# Patient Record
Sex: Female | Born: 1980 | Race: White | Hispanic: No | Marital: Married | Smoking: Former smoker
Health system: Southern US, Community
[De-identification: ages and names within clinical notes are randomized; demographics above are authoritative.]

## PROBLEM LIST (undated history)

## (undated) DIAGNOSIS — L700 Acne vulgaris: Secondary | ICD-10-CM

## (undated) DIAGNOSIS — E282 Polycystic ovarian syndrome: Secondary | ICD-10-CM

## (undated) DIAGNOSIS — D509 Iron deficiency anemia, unspecified: Secondary | ICD-10-CM

## (undated) HISTORY — DX: Acne vulgaris: L70.0

## (undated) HISTORY — DX: Polycystic ovarian syndrome: E28.2

## (undated) HISTORY — DX: Iron deficiency anemia, unspecified: D50.9

---

## 2004-11-17 HISTORY — PX: LAPAROSCOPIC CHOLECYSTECTOMY: SUR755

## 2004-12-14 ENCOUNTER — Emergency Department: Payer: Self-pay | Admitting: Emergency Medicine

## 2004-12-18 ENCOUNTER — Ambulatory Visit: Payer: Self-pay | Admitting: Emergency Medicine

## 2005-01-03 ENCOUNTER — Ambulatory Visit: Payer: Self-pay | Admitting: General Surgery

## 2007-11-18 HISTORY — PX: TUBAL LIGATION: SHX77

## 2010-03-18 ENCOUNTER — Encounter: Payer: Self-pay | Admitting: Family Medicine

## 2010-06-18 ENCOUNTER — Encounter: Payer: Self-pay | Admitting: Family Medicine

## 2010-08-29 ENCOUNTER — Encounter: Payer: Self-pay | Admitting: Family Medicine

## 2010-10-25 ENCOUNTER — Ambulatory Visit: Payer: Self-pay | Admitting: Family Medicine

## 2010-12-06 ENCOUNTER — Other Ambulatory Visit
Admission: RE | Admit: 2010-12-06 | Discharge: 2010-12-06 | Payer: Self-pay | Source: Home / Self Care | Admitting: Family Medicine

## 2010-12-06 ENCOUNTER — Ambulatory Visit
Admission: RE | Admit: 2010-12-06 | Discharge: 2010-12-06 | Payer: Self-pay | Source: Home / Self Care | Attending: Family Medicine | Admitting: Family Medicine

## 2010-12-06 ENCOUNTER — Other Ambulatory Visit: Payer: Self-pay | Admitting: Family Medicine

## 2010-12-06 DIAGNOSIS — B079 Viral wart, unspecified: Secondary | ICD-10-CM | POA: Insufficient documentation

## 2010-12-12 ENCOUNTER — Encounter: Payer: Self-pay | Admitting: Family Medicine

## 2010-12-19 NOTE — Assessment & Plan Note (Signed)
Summary: NEW PT TO BE ESTABLISHED/JRR   Vital Signs:  Patient profile:   30 year old female Height:      64.50 inches Weight:      162 pounds BMI:     27.48 Temp:     98.0 degrees F oral Pulse rate:   72 / minute Pulse rhythm:   regular BP sitting:   120 / 86  (right arm) Cuff size:   regular  Vitals Entered By: Linde Gillis CMA Duncan Dull) (2010/12/12 10:53 AM) CC: new patient, establish care   History of Present Illness: 30 yo here to establish care.  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  Well woman- G3P3, last pap in 2008.  No h/o abnormal pap smears.  Sexuallya active with husband only.  Denies any vaginal discharge, lesions or dysuria. s/p BTL.  Lesions on her hands- noticed it a few months ago.  Started a raised lesion next to one finger nail on left hand and now has several on different fingers.  Non painful.  She is embarrassed of how they look.  Preventive Screening-Counseling & Management  Alcohol-Tobacco     Smoking Status: never      Drug Use:  no.    Current Medications (verified): 1)  Salicylic Acid 17 % Soln (Salicylic Acid) .... Apply To Each Wart and Allow To Dry. May Repeat Once or Twice Daily, Up To 12 Weeks. 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 Eyes:  Denies blurring. ENT:  Denies difficulty swallowing. CV:  Denies  chest pain or discomfort. Resp:  Denies shortness of breath. GI:  Denies abdominal pain. GU:  Denies abnormal vaginal bleeding, discharge, and dysuria. MS:  Denies joint pain, joint redness, and joint swelling. Derm:  Denies rash. Neuro:  Denies headaches. Psych:  Denies anxiety and depression. Endo:  Denies cold intolerance and heat intolerance. Heme:  Denies abnormal bruising and bleeding.  Physical Exam  General:  Well-developed,well-nourished,in no acute distress; alert,appropriate and cooperative throughout examination Head:  normocephalic, atraumatic, no abnormalities observed, and no abnormalities palpated.   Eyes:  vision grossly intact and pupils equal.   Ears:  R ear normal and L ear normal.   Nose:  no external deformity.   Mouth:  good dentition.   Neck:  No deformities, masses, or tenderness noted. Breasts:  No mass, nodules, thickening, tenderness, bulging, retraction, inflamation, nipple discharge or skin changes noted.   Lungs:  Normal respiratory effort, chest expands symmetrically. Lungs are clear to auscultation, no crackles or wheezes. Heart:  Normal rate and regular rhythm. S1 and S2 normal without gallop, murmur, click, rub or other extra sounds. Abdomen:  Bowel sounds positive,abdomen soft and non-tender without masses, organomegaly or hernias noted. Rectal:  no external abnormalities.   Genitalia:  Pelvic Exam:        External: normal female genitalia without lesions or masses  Vagina: normal without lesions or masses        Cervix: normal without lesions or masses        Adnexa: normal bimanual exam without masses or fullness        Uterus: normal by palpation        Pap smear: performed Msk:  No deformity or scoliosis noted of thoracic or lumbar spine.   Extremities:  no edema Neurologic:  alert & oriented X3 and gait normal.   Skin:  multiple periungual warts on left hand- 1st, 2nd, 3rd digit Axillary Nodes:  No palpable lymphadenopathy Psych:   Cognition and judgment appear intact. Alert and cooperative with normal attention span and concentration. No apparent delusions, illusions, hallucinations   Impression & Recommendations:  Problem # 1:  Preventive Health Care (ICD-V70.0) Reviewed preventive care protocols, scheduled due services, and updated immunizations Discussed nutrition, exercise, diet, and healthy lifestyle.  Brings in labs from her health assessment at work.  All within normal limits, discussed with pt and entered into emr.  Problem # 2:  WARTS, MULTIPLE (ICD-078.10) Assessment: New periungual.  discussed treatment options.  Cryotherapy not ideal given location in nail bed.  Will try topical salicylic acid first.  Follow up in 3 months, sooner if no improvement or they worsen.  Complete Medication List: 1)  Salicylic Acid 17 % Soln (Salicylic acid) .... Apply to each wart and allow to dry. may repeat once or twice daily, up to 12 weeks. apply to clean dry area Prescriptions: SALICYLIC ACID 17 % SOLN (SALICYLIC ACID) Apply to each wart and allow to dry. May repeat once or twice daily, up to 12 weeks. Apply to clean dry area  #1 x 0   Entered and Authorized by:   Ruthe Mannan MD   Signed by:   Ruthe Mannan MD on 12/06/2010   Method used:   Print then Give to Patient   RxID:   [id number]    Orders Added: 1)  New Patient 18-39 years [99385] 2)  New Patient Level II [99202]    Prior Medications (reviewed today): None Current Allergies (reviewed today): No known allergies   Flu Vaccine Result Date:  08/29/2010 Flu Vaccine Result:  historical TD Result Date:  01/19/2008 TD Result:  historical TD Next Due:  10 yr HDL Result Date:  08/29/2010 HDL Result:  41 LDL Result Date:  08/29/2010 LDL Result:  69

## 2010-12-19 NOTE — Letter (Signed)
Summary: Results Follow up Letter  [facility]  [address] [place], [place] [address]   Phone: [phone]  Fax: [phone]    12/12/2010 MRN: [id number]  Leslie Jordan [address] [place], [place]  [address]  Dear Ms. Laszlo,  The following are the results of your recent test(s):  Test         Result    Pap Smear:        Normal __X___  Not Normal _____ Comments: GC/Chlamydia neg ______________________________________________________ Cholesterol: LDL(Bad cholesterol):         Your goal is less than:         HDL (Good cholesterol):       Your goal is more than: Comments:  ______________________________________________________ Mammogram:        Normal _____  Not Normal _____ Comments:  ___________________________________________________________________ Hemoccult:        Normal _____  Not normal _______ Comments:    _____________________________________________________________________ Other Tests:    We routinely do not discuss normal results over the telephone.  If you desire a copy of the results, or you have any questions about this information we can discuss them at your next office visit.   Sincerely,      Dr. Ruthe Mannan

## 2012-12-31 ENCOUNTER — Ambulatory Visit: Payer: Self-pay | Admitting: Family Medicine

## 2013-01-05 ENCOUNTER — Ambulatory Visit: Payer: Self-pay | Admitting: Internal Medicine

## 2013-01-07 ENCOUNTER — Encounter: Payer: Self-pay | Admitting: Family Medicine

## 2013-01-07 ENCOUNTER — Ambulatory Visit (INDEPENDENT_AMBULATORY_CARE_PROVIDER_SITE_OTHER): Payer: 59 | Admitting: Family Medicine

## 2013-01-07 VITALS — BP 118/78 | HR 80 | Temp 99.4°F | Wt 191.0 lb

## 2013-01-07 DIAGNOSIS — R5381 Other malaise: Secondary | ICD-10-CM | POA: Insufficient documentation

## 2013-01-07 DIAGNOSIS — R5383 Other fatigue: Secondary | ICD-10-CM

## 2013-01-07 DIAGNOSIS — R635 Abnormal weight gain: Secondary | ICD-10-CM | POA: Insufficient documentation

## 2013-01-07 LAB — CBC WITH DIFFERENTIAL/PLATELET
Basophils Relative: 1 % (ref 0–1)
Hemoglobin: 12.2 g/dL (ref 12.0–15.0)
Lymphocytes Relative: 30 % (ref 12–46)
Lymphs Abs: 2.5 10*3/uL (ref 0.7–4.0)
Monocytes Relative: 6 % (ref 3–12)
Neutro Abs: 5.3 10*3/uL (ref 1.7–7.7)
Neutrophils Relative %: 62 % (ref 43–77)
RBC: 4.51 MIL/uL (ref 3.87–5.11)
WBC: 8.5 10*3/uL (ref 4.0–10.5)

## 2013-01-07 NOTE — Patient Instructions (Addendum)
Good to see you. We will call you with your lab results. If your labs are normal, we can talk about phentermine.

## 2013-01-07 NOTE — Progress Notes (Signed)
  Subjective:    Patient ID: Leslie Jordan, female    DOB: Apr 05, 1981, 32 y.o.   MRN: [id number]  HPI  Very pleasant 32 yo here to discuss weight gain and fatigue x 1 year.  Last saw her in 11/2010 and she weighed 162 pounds.  She states that she is very active and exercises regularly so not sure why she has gained 30 pounds in the past year. She does feel very fatigued and does endorse some constipation.  No hot or cold intolerance.  No palpitations or depression. She does have a family h/o both over and under active thyroid.  Brings in lab work from work- a1c, lipid panel and renal function normal.  Patient Active Problem List  Diagnosis  . WARTS, MULTIPLE  . Weight gain  . Other malaise and fatigue   No past medical history on file. No past surgical history on file. History  Substance Use Topics  . Smoking status: Former Games developer  . Smokeless tobacco: Not on file  . Alcohol Use: Not on file   No family history on file. No Known Allergies No current outpatient prescriptions on file prior to visit.   No current facility-administered medications on file prior to visit.   The PMH, PSH, Social History, Family History, Medications, and allergies have been reviewed in [facility], and have been updated if relevant.   Review of Systems See HPI Sleeping ok    Objective:   Physical Exam BP 118/78  Pulse 80  Temp(Src) 99.4 F (37.4 C)  Wt 191 lb (86.637 kg)  BMI 32.29 kg/m2 Wt Readings from Last 3 Encounters:  01/07/13 191 lb (86.637 kg)   General:  Well-developed,well-nourished,in no acute distress; alert,appropriate and cooperative throughout examination Head:  normocephalic and atraumatic.   Eyes:  vision grossly intact, pupils equal, pupils round, and pupils reactive to light.   Ears:  R ear normal and L ear normal.   Nose:  no external deformity.   Mouth:  good dentition Neck:  No masses or thyromegaly.   Lungs:  Normal respiratory effort, chest expands symmetrically. Lungs  are clear to auscultation, no crackles or wheezes. Heart:  Normal rate and regular rhythm. S1 and S2 normal without gallop, murmur, click, rub or other extra sounds. Abdomen:  Bowel sounds positive,abdomen soft and non-tender without masses, organomegaly or hernias noted. Msk:  No deformity or scoliosis noted of thoracic or lumbar spine.   Extremities:  No clubbing, cyanosis, edema, or deformity noted with normal full range of motion of all joints.   Neurologic:  alert & oriented X3 and gait normal.   Skin:  Intact without suspicious lesions or rashes Cervical Nodes:  No lymphadenopathy noted Axillary Nodes:  No palpable lymphadenopathy Psych:  Cognition and judgment appear intact. Alert and cooperative with normal attention span and concentration. No apparent delusions, illusions, hallucinations    Assessment & Plan:  1. Weight gain Will check thyroid function.  If normal, we did discuss proceeding with nutrition referral and possible short course of an appetite suppressant.  The patient indicates understanding of these issues and agrees with the plan.  *- TSH - T4, Free  2. Other malaise and fatigue See above. - TSH - T4, Free - CBC with Differential

## 2013-01-08 LAB — TSH: TSH: 1.432 u[IU]/mL (ref 0.350–4.500)

## 2013-01-08 LAB — T4, FREE: Free T4: 0.93 ng/dL (ref 0.80–1.80)

## 2013-01-11 ENCOUNTER — Other Ambulatory Visit: Payer: Self-pay | Admitting: Family Medicine

## 2013-01-11 MED ORDER — PHENTERMINE HCL 15 MG PO CAPS: 15.0000 mg | ORAL_CAPSULE | ORAL | Status: DC

## 2013-01-20 ENCOUNTER — Encounter: Payer: Self-pay | Admitting: Family Medicine

## 2013-02-04 ENCOUNTER — Encounter: Payer: Self-pay | Admitting: Family Medicine

## 2013-02-04 ENCOUNTER — Ambulatory Visit (INDEPENDENT_AMBULATORY_CARE_PROVIDER_SITE_OTHER): Payer: 59 | Admitting: Family Medicine

## 2013-02-04 VITALS — BP 92/70 | HR 84 | Temp 98.1°F | Wt 187.0 lb

## 2013-02-04 DIAGNOSIS — R635 Abnormal weight gain: Secondary | ICD-10-CM

## 2013-02-04 MED ORDER — PHENTERMINE HCL 30 MG PO CAPS: 30.0000 mg | ORAL_CAPSULE | ORAL | Status: DC

## 2013-02-04 NOTE — Patient Instructions (Addendum)
Good to see you. You look great! Please come see me in one month.   

## 2013-02-04 NOTE — Progress Notes (Signed)
  Subjective:    Patient ID: Leslie Jordan, female    DOB: [birth year], 32 y.o.   MRN: [id number]  HPI  Very pleasant 32 yo here for one month follow up weight gain.    Last saw her in 11/2010 and she weighed 162 pounds.  She states that she is very active and exercises regularly so not sure why she has gained 30 pounds in the past year.  Last month, started phentermine 15 mg daily.  It has decreased appetite.    Denies any CP, SOB or palpitations.    Patient Active Problem List  Diagnosis  . WARTS, MULTIPLE  . Weight gain  . Other malaise and fatigue   No past medical history on file. No past surgical history on file. History  Substance Use Topics  . Smoking status: Former Games developer  . Smokeless tobacco: Not on file  . Alcohol Use: Not on file   No family history on file. No Known Allergies Current Outpatient Prescriptions on File Prior to Visit  Medication Sig Dispense Refill  . phentermine 15 MG capsule Take 1 capsule (15 mg total) by mouth every morning.  30 capsule  0   No current facility-administered medications on file prior to visit.   The PMH, PSH, Social History, Family History, Medications, and allergies have been reviewed in [facility], and have been updated if relevant.   Review of Systems See HPI Sleeping ok    Objective:   Physical Exam BP 92/70  Pulse 84  Temp(Src) 98.1 F (36.7 C)  Wt 187 lb (84.823 kg)  BMI 31.61 kg/m2 Wt Readings from Last 3 Encounters:  02/04/13 187 lb (84.823 kg)  01/07/13 191 lb (86.637 kg)    General:  Well-developed,well-nourished,in no acute distress; alert,appropriate and cooperative throughout examination Head:  normocephalic and atraumatic.   Eyes:  vision grossly intact, pupils equal, pupils round, and pupils reactive to light.   Ears:  R ear normal and L ear normal.   Nose:  no external deformity.   Mouth:  good dentition Neck:  No masses or thyromegaly.   Lungs:  Normal respiratory effort, chest expands symmetrically.  Lungs are clear to auscultation, no crackles or wheezes. Heart:  Normal rate and regular rhythm. S1 and S2 normal without gallop, murmur, click, rub or other extra sounds. Abdomen:  Bowel sounds positive,abdomen soft and non-tender without masses, organomegaly or hernias noted. Msk:  No deformity or scoliosis noted of thoracic or lumbar spine.   Extremities:  No clubbing, cyanosis, edema, or deformity noted with normal full range of motion of all joints.   Neurologic:  alert & oriented X3 and gait normal.   Skin:  Intact without suspicious lesions or rashes Cervical Nodes:  No lymphadenopathy noted Axillary Nodes:  No palpable lymphadenopathy Psych:  Cognition and judgment appear intact. Alert and cooperative with normal attention span and concentration. No apparent delusions, illusions, hallucinations    Assessment & Plan:  1. Weight gain Improved.  Congratulated her on her success. Increase dose of phentermine to 30 mg daily.  Follow up in one month. The patient indicates understanding of these issues and agrees with the plan.

## 2013-02-28 ENCOUNTER — Ambulatory Visit (INDEPENDENT_AMBULATORY_CARE_PROVIDER_SITE_OTHER): Payer: 59 | Admitting: Family Medicine

## 2013-02-28 ENCOUNTER — Encounter: Payer: Self-pay | Admitting: Family Medicine

## 2013-02-28 VITALS — BP 110/80 | HR 92 | Temp 98.2°F | Wt 186.0 lb

## 2013-02-28 DIAGNOSIS — R635 Abnormal weight gain: Secondary | ICD-10-CM

## 2013-02-28 MED ORDER — PHENTERMINE HCL 37.5 MG PO CAPS: 37.5000 mg | ORAL_CAPSULE | ORAL | Status: DC

## 2013-02-28 NOTE — Patient Instructions (Addendum)
Please call me in one month with your blood pressure readings.

## 2013-02-28 NOTE — Progress Notes (Signed)
  Subjective:    Patient ID: Leslie Jordan, female    DOB: 02/14/1981, 32 y.o.   MRN: [id number]  HPI  Very pleasant 32 yo here for one month follow up weight gain.    Two months ago, started phentermine.  It has decreased appetite.    Denies any CP, SOB or palpitations.    Has not been exercising as much this past couple of weeks.  Patient Active Problem List  Diagnosis  . WARTS, MULTIPLE  . Weight gain  . Other malaise and fatigue   No past medical history on file. No past surgical history on file. History  Substance Use Topics  . Smoking status: Former Games developer  . Smokeless tobacco: Not on file  . Alcohol Use: Not on file   No family history on file. No Known Allergies Current Outpatient Prescriptions on File Prior to Visit  Medication Sig Dispense Refill  . phentermine 30 MG capsule Take 1 capsule (30 mg total) by mouth every morning.  30 capsule  0   No current facility-administered medications on file prior to visit.   The PMH, PSH, Social History, Family History, Medications, and allergies have been reviewed in [facility], and have been updated if relevant.   Review of Systems See HPI Sleeping ok    Objective:   Physical Exam BP 110/80  Pulse 92  Temp(Src) 98.2 F (36.8 C)  Wt 186 lb (84.369 kg)  BMI 31.45 kg/m2 Wt Readings from Last 3 Encounters:  02/28/13 186 lb (84.369 kg)  02/04/13 187 lb (84.823 kg)  01/07/13 191 lb (86.637 kg)    General:  Well-developed,well-nourished,in no acute distress; alert,appropriate and cooperative throughout examination Head:  normocephalic and atraumatic.   Eyes:  vision grossly intact, pupils equal, pupils round, and pupils reactive to light.   Ears:  R ear normal and L ear normal.   Nose:  no external deformity.   Mouth:  good dentition Neck:  No masses or thyromegaly.   Lungs:  Normal respiratory effort, chest expands symmetrically. Lungs are clear to auscultation, no crackles or wheezes. Heart:  Normal rate and regular  rhythm. S1 and S2 normal without gallop, murmur, click, rub or other extra sounds. Abdomen:  Bowel sounds positive,abdomen soft and non-tender without masses, organomegaly or hernias noted. Msk:  No deformity or scoliosis noted of thoracic or lumbar spine.   Extremities:  No clubbing, cyanosis, edema, or deformity noted with normal full range of motion of all joints.   Neurologic:  alert & oriented X3 and gait normal.   Skin:  Intact without suspicious lesions or rashes Cervical Nodes:  No lymphadenopathy noted Axillary Nodes:  No palpable lymphadenopathy Psych:  Cognition and judgment appear intact. Alert and cooperative with normal attention span and concentration. No apparent delusions, illusions, hallucinations    Assessment & Plan:  1. Weight gain Improved.  Congratulated her on her success. Increase dose of phentermine to 37.5 mg daily.  Follow up in one month. The patient indicates understanding of these issues and agrees with the plan.

## 2013-03-11 ENCOUNTER — Ambulatory Visit: Payer: 59 | Admitting: Family Medicine

## 2013-04-13 ENCOUNTER — Other Ambulatory Visit: Payer: Self-pay

## 2013-04-13 MED ORDER — PHENTERMINE HCL 37.5 MG PO CAPS: 37.5000 mg | ORAL_CAPSULE | ORAL | Status: DC

## 2013-04-13 NOTE — Telephone Encounter (Signed)
Ok to print and put in my box for signature. 

## 2013-04-13 NOTE — Telephone Encounter (Signed)
Ok to call in

## 2013-04-13 NOTE — Telephone Encounter (Signed)
Pt request rx phentermine and BP readings 03/08/13 BP 117/78;  04/09/13 BP 124/76 and 04/13/13 BP 117/80. No H/A, dizziness or CP. Call when rx ready for pick up.

## 2013-04-13 NOTE — Telephone Encounter (Signed)
This can actually be called to her pharmacy.

## 2013-04-13 NOTE — Telephone Encounter (Signed)
Medicine called to pharmacy, advised patient. 

## 2013-08-12 ENCOUNTER — Encounter: Payer: Self-pay | Admitting: Family Medicine

## 2013-08-12 ENCOUNTER — Ambulatory Visit (INDEPENDENT_AMBULATORY_CARE_PROVIDER_SITE_OTHER): Payer: 59 | Admitting: Family Medicine

## 2013-08-12 VITALS — BP 118/74 | HR 76 | Temp 98.2°F | Wt 176.5 lb

## 2013-08-12 DIAGNOSIS — R194 Change in bowel habit: Secondary | ICD-10-CM | POA: Insufficient documentation

## 2013-08-12 DIAGNOSIS — R198 Other specified symptoms and signs involving the digestive system and abdomen: Secondary | ICD-10-CM

## 2013-08-12 NOTE — Progress Notes (Signed)
  Subjective:    Patient ID: Leslie Jordan, female    DOB: 1981-02-11, 32 y.o.   MRN: [id number]  HPI  Very pleasant 32 yo female here for 2 months of changes in bowel habits.  Alternating between weeks of diarrhea and weeks of constipation and bloating for past 2 months.  No blood or mucous in her stools.  Has not noticed if certain foods make it worse.  No known FH of IBD.  Patient Active Problem List   Diagnosis Date Noted  . Weight gain 01/07/2013  . Other malaise and fatigue 01/07/2013  . WARTS, MULTIPLE 12/06/2010   No past medical history on file. No past surgical history on file. History  Substance Use Topics  . Smoking status: Former Games developer  . Smokeless tobacco: Not on file  . Alcohol Use: No   No family history on file. No Known Allergies No current outpatient prescriptions on file prior to visit.   No current facility-administered medications on file prior to visit.   The PMH, PSH, Social History, Family History, Medications, and allergies have been reviewed in [facility], and have been updated if relevant.   Review of Systems See HPI No fevers No decreased appetite. Wt Readings from Last 3 Encounters:  08/12/13 176 lb 8 oz (80.06 kg)  02/28/13 186 lb (84.369 kg)  02/04/13 187 lb (84.823 kg)   No abdominal pain    Objective:   Physical Exam BP 118/74  Pulse 76  Temp(Src) 98.2 F (36.8 C) (Oral)  Wt 176 lb 8 oz (80.06 kg)  BMI 29.84 kg/m2  LMP 08/02/2013  General:  Well-developed,well-nourished,in no acute distress; alert,appropriate and cooperative throughout examination Head:  normocephalic and atraumatic.   Abdomen:  Bowel sounds positive,abdomen soft and non-tender without masses, organomegaly or hernias noted. Msk:  No deformity or scoliosis noted of thoracic or lumbar spine.   Extremities:  No clubbing, cyanosis, edema, or deformity noted with normal full range of motion of all joints.   Neurologic:  alert & oriented X3 and gait normal.    Skin:  Intact without suspicious lesions or rashes Psych:  Cognition and judgment appear intact. Alert and cooperative with normal attention span and concentration. No apparent delusions, illusions, hallucinations     Assessment & Plan:  1. Bowel habit changes New. No red flag symptoms. 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 See AVS for supportive care- start align, keep food journal and eliminate gasy foods. She will call me in a few weeks with an update. The patient indicates understanding of these issues and agrees with the plan.

## 2013-08-12 NOTE — Patient Instructions (Addendum)
Increase fiber and water, and try over the counter gas-x (four times a day when necessary) or beano for bloating.   Trial of align for bloating/IBS symptoms (OTC).  After two weeks, if no improvement, try pepcid 20 mg daily for the next 2 weeks.  Exclude gas producing foods (beans, onions, celery, carrots, raisins, bananas, apricots, prunes, brussel sprouts, wheat germ, pretzels)   Consider trail of lactose free diet (back off milk)   Call me in a few weeks with an update.

## 2013-09-22 ENCOUNTER — Other Ambulatory Visit: Payer: Self-pay

## 2013-11-25 ENCOUNTER — Other Ambulatory Visit (INDEPENDENT_AMBULATORY_CARE_PROVIDER_SITE_OTHER): Payer: 59

## 2013-11-25 ENCOUNTER — Other Ambulatory Visit: Payer: Self-pay | Admitting: Family Medicine

## 2013-11-25 DIAGNOSIS — Z0000 Encounter for general adult medical examination without abnormal findings: Secondary | ICD-10-CM

## 2013-11-25 DIAGNOSIS — Z136 Encounter for screening for cardiovascular disorders: Secondary | ICD-10-CM

## 2013-11-25 LAB — COMPREHENSIVE METABOLIC PANEL
ALBUMIN: 4 g/dL (ref 3.5–5.2)
ALK PHOS: 80 U/L (ref 39–117)
ALT: 17 U/L (ref 0–35)
AST: 21 U/L (ref 0–37)
BUN: 10 mg/dL (ref 6–23)
CALCIUM: 9.1 mg/dL (ref 8.4–10.5)
CHLORIDE: 103 meq/L (ref 96–112)
CO2: 29 meq/L (ref 19–32)
Creatinine, Ser: 0.7 mg/dL (ref 0.4–1.2)
GFR: 96.76 mL/min (ref >60.00)
GLUCOSE: 96 mg/dL (ref 70–99)
POTASSIUM: 4.4 meq/L (ref 3.5–5.1)
SODIUM: 137 meq/L (ref 135–145)
TOTAL PROTEIN: 7.1 g/dL (ref 6.0–8.3)
Total Bilirubin: 0.7 mg/dL (ref 0.3–1.2)

## 2013-11-25 LAB — CBC WITH DIFFERENTIAL/PLATELET
BASOS ABS: 0 10*3/uL (ref 0.0–0.1)
Basophils Relative: 0.7 % (ref 0.0–3.0)
EOS PCT: 1.2 % (ref 0.0–5.0)
Eosinophils Absolute: 0.1 10*3/uL (ref 0.0–0.7)
HCT: 39.2 % (ref 36.0–46.0)
Hemoglobin: 13.3 g/dL (ref 12.0–15.0)
Lymphocytes Relative: 28.6 % (ref 12.0–46.0)
Lymphs Abs: 1.9 10*3/uL (ref 0.7–4.0)
MCHC: 33.8 g/dL (ref 30.0–36.0)
MCV: 83.1 fl (ref 78.0–100.0)
MONO ABS: 0.5 10*3/uL (ref 0.1–1.0)
MONOS PCT: 7.3 % (ref 3.0–12.0)
NEUTROS PCT: 62.2 % (ref 43.0–77.0)
Neutro Abs: 4.1 10*3/uL (ref 1.4–7.7)
Platelets: 333 10*3/uL (ref 150.0–400.0)
RBC: 4.72 Mil/uL (ref 3.87–5.11)
RDW: 15.1 % — AB (ref 11.5–14.6)
WBC: 6.7 10*3/uL (ref 4.5–10.5)

## 2013-11-25 LAB — LIPID PANEL
CHOLESTEROL: 168 mg/dL (ref 0–200)
HDL: 45.6 mg/dL (ref >39.00)
LDL CALC: 107 mg/dL — AB (ref 0–99)
TRIGLYCERIDES: 78 mg/dL (ref 0.0–149.0)
Total CHOL/HDL Ratio: 4
VLDL: 15.6 mg/dL (ref 0.0–40.0)

## 2013-11-25 LAB — TSH: TSH: 0.66 u[IU]/mL (ref 0.35–5.50)

## 2013-12-02 ENCOUNTER — Other Ambulatory Visit (HOSPITAL_COMMUNITY)
Admission: RE | Admit: 2013-12-02 | Discharge: 2013-12-02 | Disposition: A | Discharge status: Home / Self Care | Payer: 59 | Source: Ambulatory Visit | Attending: Family Medicine | Admitting: Family Medicine

## 2013-12-02 ENCOUNTER — Ambulatory Visit (INDEPENDENT_AMBULATORY_CARE_PROVIDER_SITE_OTHER): Payer: 59 | Admitting: Family Medicine

## 2013-12-02 ENCOUNTER — Encounter: Payer: Self-pay | Admitting: Family Medicine

## 2013-12-02 VITALS — BP 122/76 | HR 112 | Temp 97.8°F | Ht 64.0 in | Wt 177.5 lb

## 2013-12-02 DIAGNOSIS — R635 Abnormal weight gain: Secondary | ICD-10-CM

## 2013-12-02 DIAGNOSIS — Z124 Encounter for screening for malignant neoplasm of cervix: Secondary | ICD-10-CM | POA: Insufficient documentation

## 2013-12-02 DIAGNOSIS — Z1151 Encounter for screening for human papillomavirus (HPV): Secondary | ICD-10-CM | POA: Insufficient documentation

## 2013-12-02 DIAGNOSIS — Z0000 Encounter for general adult medical examination without abnormal findings: Secondary | ICD-10-CM

## 2013-12-02 MED ORDER — ALBUTEROL SULFATE HFA 108 (90 BASE) MCG/ACT IN AERS: 2.0000 | INHALATION_SPRAY | Freq: Four times a day (QID) | RESPIRATORY_TRACT | Status: DC | PRN

## 2013-12-02 NOTE — Progress Notes (Signed)
Subjective:    Patient ID: Leslie Jordan, female    DOB: 11-Dec-1980, 33 y.o.   MRN: [id number]  HPI  33 yo G3P3 here for CPX.  No h/o abnormal pap smears. Sexually active with husband only. Denies any vaginal discharge, lesions or dysuria.  s/p BTL.   Last pap smear was done by me in 11/2010.  Has been working on diet and exercise. Wt Readings from Last 3 Encounters:  12/02/13 177 lb 8 oz (80.513 kg)  08/12/13 176 lb 8 oz (80.06 kg)  02/28/13 186 lb (84.369 kg)     Lab Results  Component Value Date   CHOL 168 11/25/2013   HDL 45.60 11/25/2013   LDLCALC 107* 11/25/2013   TRIG 78.0 11/25/2013   CHOLHDL 4 11/25/2013    Lab Results  Component Value Date   WBC 6.7 11/25/2013   HGB 13.3 11/25/2013   HCT 39.2 11/25/2013   MCV 83.1 11/25/2013   PLT 333.0 11/25/2013   Lab Results  Component Value Date   CREATININE 0.7 11/25/2013   Lab Results  Component Value Date   NA 137 11/25/2013   K 4.4 11/25/2013   CL 103 11/25/2013   CO2 29 11/25/2013     Review of Systems    See HPI Patient reports no  vision/ hearing changes,anorexia, weight change, fever ,adenopathy, persistant / recurrent hoarseness, swallowing issues, chest pain, edema,persistant / recurrent cough, hemoptysis, dyspnea(rest, exertional, paroxysmal nocturnal), gastrointestinal  bleeding (melena, rectal bleeding), abdominal pain, excessive heart burn, GU symptoms(dysuria, hematuria, pyuria, voiding/incontinence  Issues) syncope, focal weakness, severe memory loss, concerning skin lesions, depression, anxiety, abnormal bruising/bleeding, major joint swelling, breast masses or abnormal vaginal bleeding.    Objective:   Physical Exam BP 122/76  Pulse 112  Temp(Src) 97.8 F (36.6 C) (Oral)  Ht 5\' 4"  (1.626 m)  Wt 177 lb 8 oz (80.513 kg)  BMI 30.45 kg/m2  SpO2 98%  LMP 11/08/2013 Wt Readings from Last 3 Encounters:  12/02/13 177 lb 8 oz (80.513 kg)  08/12/13 176 lb 8 oz (80.06 kg)  02/28/13 186 lb (84.369 kg)    General:   Well-developed,well-nourished,in no acute distress; alert,appropriate and cooperative throughout examination Head:  normocephalic and atraumatic.   Eyes:  vision grossly intact, pupils equal, pupils round, and pupils reactive to light.   Ears:  R ear normal and L ear normal.   Nose:  no external deformity.   Mouth:  good dentition.   Neck:  No deformities, masses, or tenderness noted. Breasts:  No mass, nodules, thickening, tenderness, bulging, retraction, inflamation, nipple discharge or skin changes noted.   Lungs:  Normal respiratory effort, chest expands symmetrically. Lungs are clear to auscultation, no crackles or wheezes. Heart:  Normal rate and regular rhythm. S1 and S2 normal without gallop, murmur, click, rub or other extra sounds. Abdomen:  Bowel sounds positive,abdomen soft and non-tender without masses, organomegaly or hernias noted. Rectal:  no external abnormalities.   Genitalia:  Pelvic Exam:        External: normal female genitalia without lesions or masses        Vagina: normal without lesions or masses        Cervix: normal without lesions or masses        Adnexa: normal bimanual exam without masses or fullness        Uterus: normal by palpation        Pap smear: performed Msk:  No deformity or scoliosis noted of thoracic or lumbar spine.  Extremities:  No clubbing, cyanosis, edema, or deformity noted with normal full range of motion of all joints.   Neurologic:  alert & oriented X3 and gait normal.   Skin:  Intact without suspicious lesions or rashes Cervical Nodes:  No lymphadenopathy noted Axillary Nodes:  No palpable lymphadenopathy Psych:  Cognition and judgment appear intact. Alert and cooperative with normal attention span and concentration. No apparent delusions, illusions, hallucinations        Assessment & Plan:

## 2013-12-02 NOTE — Assessment & Plan Note (Signed)
Reviewed preventive care protocols, scheduled due services, and updated immunizations Discussed nutrition, exercise, diet, and healthy lifestyle.  Pap smear today. 

## 2013-12-02 NOTE — Addendum Note (Signed)
Addended by: Modena Nunnery on: 12/02/2013 02:32 PM   Modules accepted: Orders

## 2013-12-02 NOTE — Patient Instructions (Signed)
Great to see you! Congratulations on your weight loss. We will call you with your pap smear results.

## 2013-12-02 NOTE — Assessment & Plan Note (Signed)
Improved.  Congratulated her on diet and exercise.

## 2013-12-02 NOTE — Progress Notes (Signed)
Pre-visit discussion using our clinic review tool. No additional management support is needed unless otherwise documented below in the visit note.  

## 2013-12-06 ENCOUNTER — Encounter: Payer: Self-pay | Admitting: *Deleted

## 2014-05-15 ENCOUNTER — Encounter: Payer: Self-pay | Admitting: Family Medicine

## 2014-05-15 ENCOUNTER — Ambulatory Visit (INDEPENDENT_AMBULATORY_CARE_PROVIDER_SITE_OTHER): Payer: 59 | Admitting: Family Medicine

## 2014-05-15 VITALS — BP 120/74 | HR 107 | Temp 98.0°F | Ht 64.5 in | Wt 169.2 lb

## 2014-05-15 DIAGNOSIS — F4323 Adjustment disorder with mixed anxiety and depressed mood: Secondary | ICD-10-CM | POA: Insufficient documentation

## 2014-05-15 MED ORDER — SERTRALINE HCL 25 MG PO TABS: 25.0000 mg | ORAL_TABLET | Freq: Every day | ORAL | Status: DC

## 2014-05-15 NOTE — Patient Instructions (Signed)
It was great to see you. Hang in there.  We are starting Zoloft 25 mg daily.  Call me in or come see me in 3 weeks.

## 2014-05-15 NOTE — Progress Notes (Signed)
Pre visit review using our clinic review tool, if applicable. No additional management support is needed unless otherwise documented below in the visit note. 

## 2014-05-15 NOTE — Assessment & Plan Note (Signed)
>  25 minutes spent in face to face time with patient, >50% spent in counselling or coordination of care Discussed tx options. She is deferring psychotherapy at this time. Does want to start medication- eRx sent for zoloft 25 mg daily. Follow up in 3 weeks. The patient indicates understanding of these issues and agrees with the plan.

## 2014-05-15 NOTE — Progress Notes (Signed)
   Subjective:   Patient ID: Leslie Jordan, female    DOB: May 23, 1981, 33 y.o.   MRN: [id number]  Leslie Jordan is a pleasant 33 y.o. year old female who presents to clinic today with Stress  on 05/15/2014  HPI: Increased stressors for past month.  35 yo son moved out -moved in with her parents.  He has been fighting with her and her husband for months.  She is not concentrating well at work now, anxious and not sleeping well.  Appetite decreased.  Not tearful. More moody. No SI or HI.  Current Outpatient Prescriptions on File Prior to Visit  Medication Sig Dispense Refill  . Adapalene (DIFFERIN) 0.3 % gel Apply 0.3 % topically at bedtime.      Marland Kitchen albuterol (PROVENTIL HFA;VENTOLIN HFA) 108 (90 BASE) MCG/ACT inhaler Inhale 2 puffs into the lungs every 6 (six) hours as needed for wheezing or shortness of breath.  1 Inhaler  0  . Clindamycin-Benzoyl Per, Refr, (DUAC) gel Apply 1.2 % topically every morning.       No current facility-administered medications on file prior to visit.    No Known Allergies  No past medical history on file.  No past surgical history on file.  No family history on file.  History   Social History  . Marital Status: Married    Spouse Name: N/A    Number of Children: N/A  . Years of Education: N/A   Occupational History  . Not on file.   Social History Main Topics  . Smoking status: Former Research scientist (life sciences)  . Smokeless tobacco: Not on file  . Alcohol Use: No  . Drug Use: No  . Sexual Activity: Not on file   Other Topics Concern  . Not on file   Social History Narrative  . No narrative on file   The PMH, PSH, Social History, Family History, Medications, and allergies have been reviewed in [facility], and have been updated if relevant.   Review of Systems    See HPI Objective:    BP 120/74  Pulse 107  Temp(Src) 98 F (36.7 C) (Oral)  Ht 5' 4.5" (1.638 m)  Wt 169 lb 4 oz (76.771 kg)  BMI 28.61 kg/m2  SpO2 97%  LMP 04/26/2014 Wt  Readings from Last 3 Encounters:  05/15/14 169 lb 4 oz (76.771 kg)  12/02/13 177 lb 8 oz (80.513 kg)  08/12/13 176 lb 8 oz (80.06 kg)     Physical Exam  Nursing note and vitals reviewed. Constitutional: She is oriented to person, place, and time. She appears well-developed and well-nourished. No distress.  Neurological: She is alert and oriented to person, place, and time.  Skin: Skin is warm and dry.  Psychiatric: She has a normal mood and affect. Her behavior is normal. Thought content normal.          Assessment & Plan:   Adjustment disorder with mixed anxiety and depressed mood No Follow-up on file.

## 2014-06-09 ENCOUNTER — Encounter: Payer: Self-pay | Admitting: Family Medicine

## 2014-06-09 ENCOUNTER — Ambulatory Visit (INDEPENDENT_AMBULATORY_CARE_PROVIDER_SITE_OTHER): Payer: 59 | Admitting: Family Medicine

## 2014-06-09 ENCOUNTER — Ambulatory Visit: Payer: 59 | Admitting: Family Medicine

## 2014-06-09 VITALS — BP 114/68 | HR 90 | Temp 97.4°F | Wt 167.0 lb

## 2014-06-09 DIAGNOSIS — F4323 Adjustment disorder with mixed anxiety and depressed mood: Secondary | ICD-10-CM

## 2014-06-09 MED ORDER — SERTRALINE HCL 25 MG PO TABS: 25.0000 mg | ORAL_TABLET | Freq: Every day | ORAL | Status: DC

## 2014-06-09 NOTE — Progress Notes (Signed)
   Subjective:   Patient ID: Leslie Jordan, female    DOB: [birth year], 33 y.o.   MRN: [id number]  ARLETH MCCULLAR is a pleasant 33 y.o. year old female who presents to clinic today with Follow-up  on 06/09/2014  HPI: Increased stressors for past month.    72 yo son moved out -moved in with her parents.  He has been fighting with her and her husband for months.    I saw her three weeks ago for difficulty concentrating, increased anxiety and insomnia.  She also complained of decreased appetite and moodiness. No SI or HI.  Started zoloft 25 mg daily.  Already feels better- sleeping better, less moody, diarrhea resolved.  No noticeable side effects. Current Outpatient Prescriptions on File Prior to Visit  Medication Sig Dispense Refill  . Adapalene (DIFFERIN) 0.3 % gel Apply 0.3 % topically at bedtime.      Marland Kitchen albuterol (PROVENTIL HFA;VENTOLIN HFA) 108 (90 BASE) MCG/ACT inhaler Inhale 2 puffs into the lungs every 6 (six) hours as needed for wheezing or shortness of breath.  1 Inhaler  0  . Clindamycin-Benzoyl Per, Refr, (DUAC) gel Apply 1.2 % topically every morning.      . sertraline (ZOLOFT) 25 MG tablet Take 1 tablet (25 mg total) by mouth at bedtime.  30 tablet  1  . spironolactone (ALDACTONE) 25 MG tablet Take 25 mg by mouth daily.       No current facility-administered medications on file prior to visit.    No Known Allergies  No past medical history on file.  No past surgical history on file.  No family history on file.  History   Social History  . Marital Status: Married    Spouse Name: N/A    Number of Children: N/A  . Years of Education: N/A   Occupational History  . Not on file.   Social History Main Topics  . Smoking status: Former Research scientist (life sciences)  . Smokeless tobacco: Not on file  . Alcohol Use: No  . Drug Use: No  . Sexual Activity: Not on file   Other Topics Concern  . Not on file   Social History Narrative  . No narrative on file   The PMH, PSH,  Social History, Family History, Medications, and allergies have been reviewed in [facility], and have been updated if relevant.   Review of Systems    See HPI Objective:    BP 114/68  Pulse 90  Temp(Src) 97.4 F (36.3 C) (Oral)  Wt 167 lb (75.751 kg)  SpO2 97%  LMP 05/20/2014 Wt Readings from Last 3 Encounters:  06/09/14 167 lb (75.751 kg)  05/15/14 169 lb 4 oz (76.771 kg)  12/02/13 177 lb 8 oz (80.513 kg)     Physical Exam  Nursing note and vitals reviewed. Constitutional: She is oriented to person, place, and time. She appears well-developed and well-nourished. No distress.  Neurological: She is alert and oriented to person, place, and time.  Skin: Skin is warm and dry.  Psychiatric: She has a normal mood and affect. Her behavior is normal. Thought content normal.          Assessment & Plan:   Adjustment disorder with mixed anxiety and depressed mood No Follow-up on file.

## 2014-06-09 NOTE — Assessment & Plan Note (Signed)
Symptoms improved. Continue current dose of zoloft. eRx sent to pharmacy. Follow up prn or in 1 year.

## 2014-06-09 NOTE — Progress Notes (Signed)
Pre visit review using our clinic review tool, if applicable. No additional management support is needed unless otherwise documented below in the visit note. 

## 2014-08-18 ENCOUNTER — Telehealth: Payer: Self-pay

## 2014-08-18 MED ORDER — SERTRALINE HCL 25 MG PO TABS: 75.0000 mg | ORAL_TABLET | Freq: Every day | ORAL | Status: DC

## 2014-08-18 NOTE — Telephone Encounter (Signed)
Left detailed voicemail of Dr. Alla German comments. Rx sent to pharmacy

## 2014-08-18 NOTE — Telephone Encounter (Signed)
Okay to increase to 75mg  daily Send Rx for 1 month (25mg  #90 x 0)-- three daily Needs appt in next month to discuss med dosage with Dr Darene Lamer

## 2014-08-18 NOTE — Telephone Encounter (Signed)
Pt left v/m requesting increase in sertraline 25 mg; pt has been taking 2 tabs daily and pts therapist has recommended taking 3 tabs daily. Pt wants to verify OK with Dr Deborra Medina to increase to 75 mg and if so pt request new rx with new instructions to [place].Please advise.pt request cb.

## 2014-10-03 ENCOUNTER — Other Ambulatory Visit: Payer: Self-pay | Admitting: Internal Medicine

## 2014-10-18 ENCOUNTER — Encounter: Payer: Self-pay | Admitting: Family Medicine

## 2014-10-18 ENCOUNTER — Ambulatory Visit (INDEPENDENT_AMBULATORY_CARE_PROVIDER_SITE_OTHER): Payer: 59 | Admitting: Family Medicine

## 2014-10-18 VITALS — BP 120/80 | HR 106 | Temp 97.7°F | Wt 172.0 lb

## 2014-10-18 DIAGNOSIS — F4323 Adjustment disorder with mixed anxiety and depressed mood: Secondary | ICD-10-CM

## 2014-10-18 MED ORDER — ALPRAZOLAM 0.25 MG PO TABS: 0.2500 mg | ORAL_TABLET | Freq: Three times a day (TID) | ORAL | Status: DC | PRN

## 2014-10-18 NOTE — Assessment & Plan Note (Signed)
Deteriorated due to acute stressors. >25 minutes spent in face to face time with patient, >50% spent in counselling or coordination of care Advised against increasing zoloft as likely would not be beneficial as her symptoms were controlled until this event. Discussed short term use of benzos- sedation and addiction potential. She does want to try this prn panic attacks/refractory insomnia. Rx given to pt. She has CPX scheduled next month- will follow up rx at that time. The patient indicates understanding of these issues and agrees with the plan.

## 2014-10-18 NOTE — Progress Notes (Signed)
   Subjective:   Patient ID: Leslie Jordan, female    DOB: Jun 08, 1981, 33 y.o.   MRN: [id number]  Leslie Jordan is a pleasant 33 y.o. year old female who presents to clinic today with Follow-up  on 10/18/2014  HPI: Anxiety-  Started Zoloft in 04/2014 due to increased stressors- 59 yo son moved out -moved in with her parents.    She was having difficulty concentrating, increased anxiety and insomnia.  She also complained of decreased appetite and moodiness. No SI or HI.  No noticeable side effects.  Was seeing a therapist who advised that she increase dose to 75 mg daily about 1/2 months ago- this dose was working well until this week when she was told that her office was going to closed down. Having more crying spells, anxiety and difficulty sleeping. No SI or HI. Appetite good- weight stable. Wt Readings from Last 3 Encounters:  10/18/14 172 lb (78.019 kg)  06/09/14 167 lb (75.751 kg)  05/15/14 169 lb 4 oz (76.771 kg)    Current Outpatient Prescriptions on File Prior to Visit  Medication Sig Dispense Refill  . Adapalene (DIFFERIN) 0.3 % gel Apply 0.3 % topically at bedtime.    Marland Kitchen albuterol (PROVENTIL HFA;VENTOLIN HFA) 108 (90 BASE) MCG/ACT inhaler Inhale 2 puffs into the lungs every 6 (six) hours as needed for wheezing or shortness of breath. 1 Inhaler 0  . sertraline (ZOLOFT) 25 MG tablet TAKE 3 TABLETS BY MOUTH AT BEDTIME. 90 tablet 0  . spironolactone (ALDACTONE) 25 MG tablet Take 25 mg by mouth daily.     No current facility-administered medications on file prior to visit.    No Known Allergies  No past medical history on file.  No past surgical history on file.  No family history on file.  History   Social History  . Marital Status: Married    Spouse Name: N/A    Number of Children: N/A  . Years of Education: N/A   Occupational History  . Not on file.   Social History Main Topics  . Smoking status: Former Research scientist (life sciences)  . Smokeless tobacco: Not on file    . Alcohol Use: No  . Drug Use: No  . Sexual Activity: Not on file   Other Topics Concern  . Not on file   Social History Narrative   The PMH, PSH, Social History, Family History, Medications, and allergies have been reviewed in [facility], and have been updated if relevant.   Review of Systems    See HPI Objective:    BP 120/80 mmHg  Pulse 106  Temp(Src) 97.7 F (36.5 C) (Tympanic)  Wt 172 lb (78.019 kg)  SpO2 98% Wt Readings from Last 3 Encounters:  10/18/14 172 lb (78.019 kg)  06/09/14 167 lb (75.751 kg)  05/15/14 169 lb 4 oz (76.771 kg)     Physical Exam  Constitutional: She appears well-developed and well-nourished. No distress.  Cardiovascular: Normal rate.   Pulmonary/Chest: Effort normal.  Skin: Skin is warm and dry.  Psychiatric: She has a normal mood and affect. Her behavior is normal. Judgment and thought content normal.          Assessment & Plan:   No diagnosis found. No Follow-up on file.

## 2014-10-18 NOTE — Patient Instructions (Signed)
Hang in there. Continue current dose of zoloft, as needed xanax- remember this is habit forming and will make you sleepy.

## 2014-10-18 NOTE — Progress Notes (Signed)
Pre visit review using our clinic review tool, if applicable. No additional management support is needed unless otherwise documented below in the visit note. 

## 2014-10-25 ENCOUNTER — Other Ambulatory Visit: Payer: Self-pay | Admitting: Family Medicine

## 2014-11-03 ENCOUNTER — Other Ambulatory Visit: Payer: Self-pay | Admitting: Family Medicine

## 2014-11-03 MED ORDER — SERTRALINE HCL 25 MG PO TABS: 75.0000 mg | ORAL_TABLET | Freq: Every day | ORAL | Status: DC

## 2014-11-20 ENCOUNTER — Telehealth: Payer: Self-pay | Admitting: Family

## 2014-11-20 DIAGNOSIS — J0120 Acute ethmoidal sinusitis, unspecified: Secondary | ICD-10-CM

## 2014-11-20 DIAGNOSIS — R05 Cough: Secondary | ICD-10-CM

## 2014-11-20 DIAGNOSIS — R059 Cough, unspecified: Secondary | ICD-10-CM

## 2014-11-20 MED ORDER — BENZONATATE 100 MG PO CAPS: 100.0000 mg | ORAL_CAPSULE | Freq: Two times a day (BID) | ORAL | Status: DC | PRN

## 2014-11-20 MED ORDER — AMOXICILLIN-POT CLAVULANATE 875-125 MG PO TABS: 1.0000 | ORAL_TABLET | Freq: Two times a day (BID) | ORAL | Status: DC

## 2014-11-20 NOTE — Progress Notes (Signed)
We are sorry that you are not feeling well.  Here is how we plan to help!  Based on what you have shared with me it looks like you have sinusitis.  Sinusitis is inflammation and infection in the sinus cavities of the head.  Based on your presentation I believe you most likely have Acute Bacterial sinusitis.  This is an infection caused by bacteria and is treated with antibiotics.  I have prescribed Augmentin, an antibiotic in the penicillin family, one tablet twice daily with food, for 7 days. Due to your severe cough interfering with sleep, I am also prescribing Tessalon Perles 1-2 capsules every 8 hours as needed for cough.  You may use an oral decongestant such as Mucinex D or if you have glaucoma or high blood pressure use plain Mucinex.  Saline nasal sprays help an can sefely be used as often as needed for congestion.  If you develop worsening sinus pain, fever or notice severe headache and vision changes, or if symptoms are not better after completion of antibiotic, please schedule an appointment with a health care provider.  Sinus infections are not as easily transmitted as other respiratory infection, however we still recommend that you avoid close contact with loved ones, especially the very young and elderly.  Remember to wash your hands thoroughly throughout the day as this is the number one way to prevent the spread of infection!  Home Care:  Only take medications as instructed by your medical team.  Complete the entire course of an antibiotic.  Do not take these medications with alcohol.  A steam or ultrasonic humidifier can help congestion.  You can place a towel over your head and breathe in the steam from hot water coming from a faucet.  Avoid close contacts especially the very young and the elderly.  Cover your mouth when you cough or sneeze.  Always remember to wash your hands.  Get Help Right Away If:  You develop worsening fever or sinus pain.  You develop a severe head  ache or visual changes.  Your symptoms persist after you have completed your treatment plan.  Make sure you  Understand these instructions.  Will watch your condition.  Will get help right away if you are not doing well or get worse.  Your e-visit answers were reviewed by a board certified advanced clinical practitioner to complete your personal care plan.  Depending on the condition, your plan could have included both over the counter or prescription medications.  If there is a problem please reply  once you have received a response from your provider.  Your safety is important to [place].  If you have drug allergies check your prescription carefully.    You can use MyChart to ask questions about today's visit, request a non-urgent call back, or ask for a work or school excuse.  You will get an e-mail in the next two days asking about your experience.  I hope that your e-visit has been valuable and will speed your recovery. Thank you for using e-visits.

## 2014-12-06 ENCOUNTER — Other Ambulatory Visit: Payer: Self-pay | Admitting: Internal Medicine

## 2014-12-06 DIAGNOSIS — Z0000 Encounter for general adult medical examination without abnormal findings: Secondary | ICD-10-CM

## 2014-12-08 ENCOUNTER — Other Ambulatory Visit: Payer: 59

## 2014-12-11 ENCOUNTER — Other Ambulatory Visit (INDEPENDENT_AMBULATORY_CARE_PROVIDER_SITE_OTHER): Payer: 59

## 2014-12-11 DIAGNOSIS — Z0000 Encounter for general adult medical examination without abnormal findings: Secondary | ICD-10-CM

## 2014-12-11 LAB — CBC
HCT: 35.8 % — ABNORMAL LOW (ref 36.0–46.0)
Hemoglobin: 11.6 g/dL — ABNORMAL LOW (ref 12.0–15.0)
MCHC: 32.5 g/dL (ref 30.0–36.0)
MCV: 72.3 fl — ABNORMAL LOW (ref 78.0–100.0)
RBC: 4.95 Mil/uL (ref 3.87–5.11)
RDW: 16.7 % — ABNORMAL HIGH (ref 11.5–15.5)
WBC: 9.7 10*3/uL (ref 4.0–10.5)

## 2014-12-11 LAB — COMPREHENSIVE METABOLIC PANEL
ALT: 21 U/L (ref 0–35)
AST: 21 U/L (ref 0–37)
Albumin: 4.3 g/dL (ref 3.5–5.2)
Alkaline Phosphatase: 94 U/L (ref 39–117)
BUN: 12 mg/dL (ref 6–23)
CO2: 27 mEq/L (ref 19–32)
Calcium: 9.4 mg/dL (ref 8.4–10.5)
Chloride: 100 mEq/L (ref 96–112)
Creatinine, Ser: 0.62 mg/dL (ref 0.40–1.20)
GFR: 116.12 mL/min (ref >60.00)
Glucose, Bld: 110 mg/dL — ABNORMAL HIGH (ref 70–99)
Potassium: 3.9 mEq/L (ref 3.5–5.1)
Sodium: 135 mEq/L (ref 135–145)
Total Bilirubin: 0.3 mg/dL (ref 0.2–1.2)
Total Protein: 7.7 g/dL (ref 6.0–8.3)

## 2014-12-11 LAB — LIPID PANEL
CHOLESTEROL: 157 mg/dL (ref 0–200)
HDL: 40.7 mg/dL (ref >39.00)
LDL CALC: 90 mg/dL (ref 0–99)
NonHDL: 116.3
Total CHOL/HDL Ratio: 4
Triglycerides: 130 mg/dL (ref 0.0–149.0)
VLDL: 26 mg/dL (ref 0.0–40.0)

## 2014-12-11 LAB — TSH: TSH: 1.69 u[IU]/mL (ref 0.35–4.50)

## 2014-12-13 ENCOUNTER — Encounter: Payer: 59 | Admitting: Family Medicine

## 2014-12-13 ENCOUNTER — Ambulatory Visit (INDEPENDENT_AMBULATORY_CARE_PROVIDER_SITE_OTHER): Payer: 59 | Admitting: Family Medicine

## 2014-12-13 ENCOUNTER — Encounter: Payer: Self-pay | Admitting: Family Medicine

## 2014-12-13 ENCOUNTER — Other Ambulatory Visit (HOSPITAL_COMMUNITY)
Admission: RE | Admit: 2014-12-13 | Discharge: 2014-12-13 | Disposition: A | Discharge status: Home / Self Care | Payer: 59 | Source: Ambulatory Visit | Attending: Family Medicine | Admitting: Family Medicine

## 2014-12-13 VITALS — BP 118/76 | HR 89 | Temp 97.9°F | Ht 64.5 in | Wt 174.5 lb

## 2014-12-13 DIAGNOSIS — Z01419 Encounter for gynecological examination (general) (routine) without abnormal findings: Secondary | ICD-10-CM | POA: Insufficient documentation

## 2014-12-13 DIAGNOSIS — D509 Iron deficiency anemia, unspecified: Secondary | ICD-10-CM

## 2014-12-13 DIAGNOSIS — Z0000 Encounter for general adult medical examination without abnormal findings: Secondary | ICD-10-CM

## 2014-12-13 DIAGNOSIS — F4323 Adjustment disorder with mixed anxiety and depressed mood: Secondary | ICD-10-CM

## 2014-12-13 DIAGNOSIS — Z1151 Encounter for screening for human papillomavirus (HPV): Secondary | ICD-10-CM | POA: Diagnosis present

## 2014-12-13 DIAGNOSIS — Z1231 Encounter for screening mammogram for malignant neoplasm of breast: Secondary | ICD-10-CM

## 2014-12-13 NOTE — Addendum Note (Signed)
Addended by: Modena Nunnery on: 12/13/2014 12:55 PM   Modules accepted: Orders

## 2014-12-13 NOTE — Assessment & Plan Note (Signed)
Well controlled on current dose of zoloft. No changes made today. 

## 2014-12-13 NOTE — Addendum Note (Signed)
Addended by: Lucille Passy on: 12/13/2014 12:55 PM   Modules accepted: Orders, SmartSet

## 2014-12-13 NOTE — Assessment & Plan Note (Signed)
Reviewed preventive care protocols, scheduled due services, and updated immunizations Discussed nutrition, exercise, diet, and healthy lifestyle.  Discussed USPSTF recommendations of cervical cancer screening.  She is aware that interval of 3 years is recommended but pt would prefer to have pap smear done today.  

## 2014-12-13 NOTE — Progress Notes (Signed)
Pre visit review using our clinic review tool, if applicable. No additional management support is needed unless otherwise documented below in the visit note. 

## 2014-12-13 NOTE — Assessment & Plan Note (Signed)
New- reassuring wide RDW. Likely due to heavy periods and recent blood donation. Recheck CBC in 1 month.

## 2014-12-13 NOTE — Patient Instructions (Signed)
Good to see you. Please schedule a lab visit in one month.

## 2014-12-13 NOTE — Progress Notes (Addendum)
Subjective:    Patient ID: Leslie Jordan, female    DOB: [birth year], 34 y.o.   MRN: [id number]  HPI  34 yo pleasant G3P3 here for CPX.  No h/o abnormal pap smears. Sexually active with husband only. Denies any vaginal discharge, lesions or dysuria.  s/p BTL.  Maternal grandmother breast CA in er 40s- she would like to get a baseline mammogram.  Last pap smear was done by me in 11/2013.  She would like it repeated today.  Anxiety has been well controlled on zoloft 75 mg daily.    Anemia- periods have been heavier lately. Denies feeling more fatigued- did just give blood a couple of weeks ago.   Lab Results  Component Value Date   CHOL 157 12/11/2014   HDL 40.70 12/11/2014   LDLCALC 90 12/11/2014   TRIG 130.0 12/11/2014   CHOLHDL 4 12/11/2014    Lab Results  Component Value Date   WBC 9.7 12/11/2014   HGB 11.6* 12/11/2014   HCT 35.8* 12/11/2014   MCV 72.3 Repeated and verified X2.* 12/11/2014   PLT 470.0 Repeated and verified X2.* 12/11/2014   Lab Results  Component Value Date   CREATININE 0.62 12/11/2014   Lab Results  Component Value Date   NA 135 12/11/2014   K 3.9 12/11/2014   CL 100 12/11/2014   CO2 27 12/11/2014     Review of Systems    See HPI Patient reports no  vision/ hearing changes,anorexia, weight change, fever ,adenopathy, persistant / recurrent hoarseness, swallowing issues, chest pain, edema,persistant / recurrent cough, hemoptysis, dyspnea(rest, exertional, paroxysmal nocturnal), gastrointestinal  bleeding (melena, rectal bleeding), abdominal pain, excessive heart burn, GU symptoms(dysuria, hematuria, pyuria, voiding/incontinence  Issues) syncope, focal weakness, severe memory loss, concerning skin lesions, depression, anxiety, abnormal bruising/bleeding, major joint swelling, breast masses or abnormal vaginal bleeding.    Objective:   Physical Exam BP 118/76 mmHg  Pulse 89  Temp(Src) 97.9 F (36.6 C) (Oral)  Ht 5' 4.5" (1.638 m)  Wt  174 lb 8 oz (79.153 kg)  BMI 29.50 kg/m2  SpO2 99%  LMP 12/01/2014 Wt Readings from Last 3 Encounters:  12/13/14 174 lb 8 oz (79.153 kg)  10/18/14 172 lb (78.019 kg)  06/09/14 167 lb (75.751 kg)    General:  Well-developed,well-nourished,in no acute distress; alert,appropriate and cooperative throughout examination Head:  normocephalic and atraumatic.   Eyes:  vision grossly intact, pupils equal, pupils round, and pupils reactive to light.   Ears:  R ear normal and L ear normal.   Nose:  no external deformity.   Mouth:  good dentition.   Neck:  No deformities, masses, or tenderness noted. Breasts:  No mass, nodules, thickening, tenderness, bulging, retraction, inflamation, nipple discharge or skin changes noted.   Lungs:  Normal respiratory effort, chest expands symmetrically. Lungs are clear to auscultation, no crackles or wheezes. Heart:  Normal rate and regular rhythm. S1 and S2 normal without gallop, murmur, click, rub or other extra sounds. Abdomen:  Bowel sounds positive,abdomen soft and non-tender without masses, organomegaly or hernias noted. Rectal:  no external abnormalities.   Genitalia:  Pelvic Exam:        External: normal female genitalia without lesions or masses        Vagina: normal without lesions or masses        Cervix: normal without lesions or masses        Adnexa: normal bimanual exam without masses or fullness        Uterus:  normal by palpation        Pap smear: performed Msk:  No deformity or scoliosis noted of thoracic or lumbar spine.   Extremities:  No clubbing, cyanosis, edema, or deformity noted with normal full range of motion of all joints.   Neurologic:  alert & oriented X3 and gait normal.   Skin:  Intact without suspicious lesions or rashes Cervical Nodes:  No lymphadenopathy noted Axillary Nodes:  No palpable lymphadenopathy Psych:  Cognition and judgment appear intact. Alert and cooperative with normal attention span and concentration. No  apparent delusions, illusions, hallucinations        Assessment & Plan:

## 2014-12-15 LAB — CYTOLOGY - PAP

## 2015-01-17 ENCOUNTER — Other Ambulatory Visit: Payer: 59

## 2015-01-19 ENCOUNTER — Ambulatory Visit: Payer: Self-pay | Admitting: Family Medicine

## 2015-01-22 ENCOUNTER — Encounter: Payer: Self-pay | Admitting: Family Medicine

## 2015-01-26 ENCOUNTER — Telehealth: Payer: Self-pay

## 2015-01-26 NOTE — Telephone Encounter (Signed)
Pt request copy of 12/13/14 pap smear report and 01/19/2015 mammogram report mailed to pts verified home address. Pt advised done.

## 2015-02-05 ENCOUNTER — Ambulatory Visit: Payer: Self-pay | Admitting: Gastroenterology

## 2015-03-15 ENCOUNTER — Encounter: Payer: Self-pay | Admitting: Family Medicine

## 2015-10-08 ENCOUNTER — Encounter: Payer: Self-pay | Admitting: Family Medicine

## 2015-10-08 ENCOUNTER — Ambulatory Visit (INDEPENDENT_AMBULATORY_CARE_PROVIDER_SITE_OTHER): Payer: 59 | Admitting: Family Medicine

## 2015-10-08 VITALS — BP 112/56 | HR 100 | Temp 98.0°F | Wt 174.0 lb

## 2015-10-08 DIAGNOSIS — Z716 Tobacco abuse counseling: Secondary | ICD-10-CM

## 2015-10-08 MED ORDER — VARENICLINE TARTRATE 0.5 MG X 11 & 1 MG X 42 PO MISC: ORAL | Status: DC

## 2015-10-08 NOTE — Progress Notes (Signed)
   Subjective:   Patient ID: Leslie Jordan, female    DOB: 1981/10/27, 34 y.o.   MRN: [id number]  Leslie Jordan is a pleasant 34 y.o. year old female who presents to clinic today with Nicotine Dependence  on 10/08/2015  HPI:  Tobacco dependence-  Smokes 1/2 ppd x 6 months.  Quit 7 years prior- cold [place] when pregnant.  Has tried patches- irritated skin.  She is ready to quit.  No current outpatient prescriptions on file prior to visit.   No current facility-administered medications on file prior to visit.    No Known Allergies  No past medical history on file.  No past surgical history on file.  No family history on file.  Social History   Social History  . Marital Status: Married    Spouse Name: N/A  . Number of Children: N/A  . Years of Education: N/A   Occupational History  . Not on file.   Social History Main Topics  . Smoking status: Former Research scientist (life sciences)  . Smokeless tobacco: Not on file  . Alcohol Use: No  . Drug Use: No  . Sexual Activity: Not on file   Other Topics Concern  . Not on file   Social History Narrative   The PMH, PSH, Social History, Family History, Medications, and allergies have been reviewed in [facility], and have been updated if relevant.   Review of Systems  Respiratory: Negative.   Cardiovascular: Negative.   Psychiatric/Behavioral: Negative.   All other systems reviewed and are negative.      Objective:    BP 112/56 mmHg  Pulse 100  Temp(Src) 98 F (36.7 C) (Oral)  Wt 174 lb (78.926 kg)  SpO2 98%   Physical Exam  Constitutional: She is oriented to person, place, and time. She appears well-developed and well-nourished. No distress.  HENT:  Head: Normocephalic.  Eyes: Conjunctivae are normal.  Cardiovascular: Normal rate.   Pulmonary/Chest: Effort normal.  Neurological: She is alert and oriented to person, place, and time. No cranial nerve deficit.  Skin: Skin is warm and dry.  Psychiatric: She has a normal  mood and affect. Her behavior is normal. Judgment and thought content normal.  Nursing note and vitals reviewed.         Assessment & Plan:   Tobacco abuse counseling No Follow-up on file.

## 2015-10-08 NOTE — Assessment & Plan Note (Signed)
>  15 minutes spent in face to face time with patient, >50% spent in counselling or coordination of care The patient was counseled on the dangers of tobacco use, and was advised to quit. Reviewed strategies to maximize success, including removing cigarettes and smoking materials from environment.  Agreed to Chantix rx- starter rx printed and given to pt. Side effects discussed.

## 2015-10-08 NOTE — Patient Instructions (Signed)
Congratulations on wanting to quit! Please pick a quit date and keep me updated.

## 2015-11-01 ENCOUNTER — Encounter: Payer: Self-pay | Admitting: Family Medicine

## 2015-11-04 ENCOUNTER — Other Ambulatory Visit: Payer: Self-pay | Admitting: Family Medicine

## 2015-11-05 MED ORDER — VARENICLINE TARTRATE 0.5 MG X 11 & 1 MG X 42 PO MISC: ORAL | Status: DC

## 2015-11-05 NOTE — Telephone Encounter (Signed)
Rx faxed to requested pharmacy 

## 2015-11-30 DIAGNOSIS — L700 Acne vulgaris: Secondary | ICD-10-CM | POA: Diagnosis not present

## 2015-12-07 DIAGNOSIS — H52223 Regular astigmatism, bilateral: Secondary | ICD-10-CM | POA: Diagnosis not present

## 2015-12-07 DIAGNOSIS — H5213 Myopia, bilateral: Secondary | ICD-10-CM | POA: Diagnosis not present

## 2015-12-07 DIAGNOSIS — Z1389 Encounter for screening for other disorder: Secondary | ICD-10-CM | POA: Diagnosis not present

## 2015-12-07 DIAGNOSIS — Z01419 Encounter for gynecological examination (general) (routine) without abnormal findings: Secondary | ICD-10-CM | POA: Diagnosis not present

## 2015-12-07 DIAGNOSIS — Z124 Encounter for screening for malignant neoplasm of cervix: Secondary | ICD-10-CM | POA: Diagnosis not present

## 2015-12-14 LAB — HM PAP SMEAR: HM Pap smear: NEGATIVE

## 2016-01-01 DIAGNOSIS — B078 Other viral warts: Secondary | ICD-10-CM | POA: Diagnosis not present

## 2016-01-01 DIAGNOSIS — L700 Acne vulgaris: Secondary | ICD-10-CM | POA: Diagnosis not present

## 2016-01-01 DIAGNOSIS — R208 Other disturbances of skin sensation: Secondary | ICD-10-CM | POA: Diagnosis not present

## 2016-02-05 ENCOUNTER — Encounter: Payer: Self-pay | Admitting: Family Medicine

## 2016-02-05 ENCOUNTER — Encounter: Payer: 59 | Attending: Family Medicine | Admitting: Dietician

## 2016-02-05 ENCOUNTER — Ambulatory Visit (INDEPENDENT_AMBULATORY_CARE_PROVIDER_SITE_OTHER): Payer: 59 | Admitting: Family Medicine

## 2016-02-05 VITALS — BP 116/60 | HR 79 | Temp 97.5°F | Ht 64.5 in | Wt 168.2 lb

## 2016-02-05 DIAGNOSIS — Z713 Dietary counseling and surveillance: Secondary | ICD-10-CM | POA: Diagnosis not present

## 2016-02-05 DIAGNOSIS — D509 Iron deficiency anemia, unspecified: Secondary | ICD-10-CM | POA: Diagnosis not present

## 2016-02-05 DIAGNOSIS — Z0000 Encounter for general adult medical examination without abnormal findings: Secondary | ICD-10-CM | POA: Diagnosis not present

## 2016-02-05 DIAGNOSIS — Z01419 Encounter for gynecological examination (general) (routine) without abnormal findings: Secondary | ICD-10-CM

## 2016-02-05 LAB — LIPID PANEL
CHOLESTEROL: 129 mg/dL (ref 0–200)
HDL: 37.7 mg/dL — ABNORMAL LOW (ref >39.00)
LDL Cholesterol: 69 mg/dL (ref 0–99)
NonHDL: 91.02
Total CHOL/HDL Ratio: 3
Triglycerides: 112 mg/dL (ref 0.0–149.0)
VLDL: 22.4 mg/dL (ref 0.0–40.0)

## 2016-02-05 LAB — COMPREHENSIVE METABOLIC PANEL
ALK PHOS: 81 U/L (ref 39–117)
ALT: 16 U/L (ref 0–35)
AST: 17 U/L (ref 0–37)
Albumin: 4.2 g/dL (ref 3.5–5.2)
BILIRUBIN TOTAL: 0.6 mg/dL (ref 0.2–1.2)
BUN: 9 mg/dL (ref 6–23)
CALCIUM: 9.2 mg/dL (ref 8.4–10.5)
CO2: 29 mEq/L (ref 19–32)
Chloride: 103 mEq/L (ref 96–112)
Creatinine, Ser: 0.65 mg/dL (ref 0.40–1.20)
GFR: 109.25 mL/min (ref >60.00)
GLUCOSE: 96 mg/dL (ref 70–99)
Potassium: 3.8 mEq/L (ref 3.5–5.1)
Sodium: 137 mEq/L (ref 135–145)
TOTAL PROTEIN: 7.1 g/dL (ref 6.0–8.3)

## 2016-02-05 LAB — CBC WITH DIFFERENTIAL/PLATELET
BASOS ABS: 0.1 10*3/uL (ref 0.0–0.1)
Basophils Relative: 0.6 % (ref 0.0–3.0)
Eosinophils Absolute: 0.1 10*3/uL (ref 0.0–0.7)
Eosinophils Relative: 0.7 % (ref 0.0–5.0)
HEMATOCRIT: 43 % (ref 36.0–46.0)
HEMOGLOBIN: 14.8 g/dL (ref 12.0–15.0)
LYMPHS PCT: 15.6 % (ref 12.0–46.0)
Lymphs Abs: 1.5 10*3/uL (ref 0.7–4.0)
MCHC: 34.3 g/dL (ref 30.0–36.0)
MCV: 86.5 fl (ref 78.0–100.0)
MONOS PCT: 8.3 % (ref 3.0–12.0)
Monocytes Absolute: 0.8 10*3/uL (ref 0.1–1.0)
NEUTROS PCT: 74.8 % (ref 43.0–77.0)
Neutro Abs: 7.4 10*3/uL (ref 1.4–7.7)
Platelets: 277 10*3/uL (ref 150.0–400.0)
RBC: 4.97 Mil/uL (ref 3.87–5.11)
RDW: 13.5 % (ref 11.5–15.5)
WBC: 9.9 10*3/uL (ref 4.0–10.5)

## 2016-02-05 LAB — TSH: TSH: 2.01 u[IU]/mL (ref 0.35–4.50)

## 2016-02-05 LAB — VITAMIN B12: Vitamin B-12: 253 pg/mL (ref 211–911)

## 2016-02-05 LAB — VITAMIN D 25 HYDROXY (VIT D DEFICIENCY, FRACTURES): VITD: 32.7 ng/mL (ref 30.00–100.00)

## 2016-02-05 NOTE — Assessment & Plan Note (Signed)
Reviewed preventive care protocols, scheduled due services, and updated immunizations Discussed nutrition, exercise, diet, and healthy lifestyle.  Orders Placed This Encounter  Procedures  . CBC with Differential/Platelet  . Comprehensive metabolic panel  . Lipid panel  . TSH  . Vitamin D, 25-hydroxy  . Vitamin B12

## 2016-02-05 NOTE — Assessment & Plan Note (Signed)
Recheck CBC today. Likely improved now that menorrhagia resolved with IUD.

## 2016-02-05 NOTE — Patient Instructions (Addendum)
Limit sweetened beverages to 2 times per day. Try half/half unsweetened/sweetened tea. Try fork method with salad dressing. Try some of the lower calorie frozen dinners but add a fruit or maybe salad. Balance meals with protein, 2-3 servings of carbohydrate and non-starchy vegetables. Measure some portions of carbohydrate foods, especially starches.

## 2016-02-05 NOTE — Patient Instructions (Signed)
Great to see you. We will call you with your lab results and you can view them online.  

## 2016-02-05 NOTE — Progress Notes (Signed)
Notes from [facility] employee "self referral" nutrition session: Start time: [place]  Met with employee to discuss his/her nutritional concerns and diet history. The employee's questions/concerns were also addressed.  We discussed the following topics:  Healthy Eating  Exercise  Weight Concerns  I also provided the following handouts as reinforcement of the educational session:  Planning a Balanced Meal  Food Guide Plate  Sample menus and/or recipes  Additional Comments: Patient has been making positive diet changes. She states she wouldn't mind losing some weight but her primary focus is healthier eating habits for herself and her family. Instructed on how to better balance carbohydrate, protein and non-starchy vegetables including identifying carbohydrate foods and portion control. Commended on her focus of healthy eating for her family vs. "the scale".  Goals Agreed Upon: Limit sweetened beverages to 2 times per day. Try half/half unsweetened/sweetened tea. Try fork method with salad dressing. Try some of the lower calorie frozen dinners but add a fruit or maybe salad. Balance meals with protein, 2-3 servings of carbohydrate and non-starchy vegetables. Measure some portions of carbohydrate foods, especially starches.  Patient wants to follow up with a medical nutrition therapy appointment. Asked her to contact her PCP and request that a referral be sent to the [facility]. Scheduled follow-up for April 17th at 4:15pm.

## 2016-02-05 NOTE — Progress Notes (Signed)
   Subjective:    Patient ID: Leslie Jordan, female    DOB: 1981/09/28, 35 y.o.   MRN: [id number]  HPI  35 yo pleasant G3P3 here for CPX.  No h/o abnormal pap smears. Sexually active with husband only. Denies any vaginal discharge, lesions or dysuria.  s/p BTL. She also has IUD which was placed in 09/2015.  Maternal grandmother breast CA in er 46s- she would like to get a baseline mammogram.  Last pap smear was done by me in 11/2014.    Quit smoking!!  Anemia-  Since IUD placed, periods have stopped but she is still fatigued.   Lab Results  Component Value Date   CHOL 157 12/11/2014   HDL 40.70 12/11/2014   LDLCALC 90 12/11/2014   TRIG 130.0 12/11/2014   CHOLHDL 4 12/11/2014    Lab Results  Component Value Date   WBC 9.7 12/11/2014   HGB 11.6* 12/11/2014   HCT 35.8* 12/11/2014   MCV 72.3 Repeated and verified X2.* 12/11/2014   PLT 470.0 Repeated and verified X2.* 12/11/2014   Lab Results  Component Value Date   CREATININE 0.62 12/11/2014   Lab Results  Component Value Date   NA 135 12/11/2014   K 3.9 12/11/2014   CL 100 12/11/2014   CO2 27 12/11/2014     Review of Systems  Constitutional: Positive for fatigue.  HENT: Negative.   Eyes: Negative.   Respiratory: Negative.   Cardiovascular: Negative.   Gastrointestinal: Negative.   Endocrine: Negative.   Genitourinary: Negative.   Musculoskeletal: Negative.   Skin: Negative.   Allergic/Immunologic: Negative.   Neurological: Negative.   Hematological: Negative.   Psychiatric/Behavioral: Negative.         Objective:   Physical Exam BP 116/60 mmHg  Pulse 79  Temp(Src) 97.5 F (36.4 C) (Oral)  Ht 5' 4.5" (1.638 m)  Wt 168 lb 4 oz (76.318 kg)  BMI 28.44 kg/m2  SpO2 98% Wt Readings from Last 3 Encounters:  02/05/16 168 lb 4 oz (76.318 kg)  10/08/15 174 lb (78.926 kg)  12/13/14 174 lb 8 oz (79.153 kg)    General:  Well-developed,well-nourished,in no acute distress; alert,appropriate and  cooperative throughout examination Head:  normocephalic and atraumatic.   Eyes:  vision grossly intact, pupils equal, pupils round, and pupils reactive to light.   Ears:  R ear normal and L ear normal.   Nose:  no external deformity.   Mouth:  good dentition.   Neck:  No deformities, masses, or tenderness noted. Lungs:  Normal respiratory effort, chest expands symmetrically. Lungs are clear to auscultation, no crackles or wheezes. Heart:  Normal rate and regular rhythm. S1 and S2 normal without gallop, murmur, click, rub or other extra sounds. Abdomen:  Bowel sounds positive,abdomen soft and non-tender without masses, organomegaly or hernias noted. Msk:  No deformity or scoliosis noted of thoracic or lumbar spine.   Extremities:  No clubbing, cyanosis, edema, or deformity noted with normal full range of motion of all joints.   Neurologic:  alert & oriented X3 and gait normal.   Skin:  Intact without suspicious lesions or rashes Cervical Nodes:  No lymphadenopathy noted Axillary Nodes:  No palpable lymphadenopathy Psych:  Cognition and judgment appear intact. Alert and cooperative with normal attention span and concentration. No apparent delusions, illusions, hallucinations        Assessment & Plan:

## 2016-02-05 NOTE — Progress Notes (Signed)
Pre visit review using our clinic review tool, if applicable. No additional management support is needed unless otherwise documented below in the visit note. 

## 2016-02-12 ENCOUNTER — Encounter: Payer: Self-pay | Admitting: Family Medicine

## 2016-02-12 ENCOUNTER — Other Ambulatory Visit: Payer: Self-pay | Admitting: Family Medicine

## 2016-02-12 DIAGNOSIS — E539 Vitamin B deficiency, unspecified: Secondary | ICD-10-CM

## 2016-03-03 ENCOUNTER — Encounter: Payer: 59 | Attending: Family Medicine | Admitting: Dietician

## 2016-03-03 ENCOUNTER — Encounter: Payer: Self-pay | Admitting: Dietician

## 2016-03-03 VITALS — Ht 64.5 in | Wt 170.0 lb

## 2016-03-03 DIAGNOSIS — Z713 Dietary counseling and surveillance: Secondary | ICD-10-CM | POA: Insufficient documentation

## 2016-03-03 DIAGNOSIS — E538 Deficiency of other specified B group vitamins: Secondary | ICD-10-CM

## 2016-03-03 DIAGNOSIS — B078 Other viral warts: Secondary | ICD-10-CM | POA: Diagnosis not present

## 2016-03-03 DIAGNOSIS — R208 Other disturbances of skin sensation: Secondary | ICD-10-CM | POA: Diagnosis not present

## 2016-03-03 DIAGNOSIS — Z79899 Other long term (current) drug therapy: Secondary | ICD-10-CM | POA: Diagnosis not present

## 2016-03-03 DIAGNOSIS — L700 Acne vulgaris: Secondary | ICD-10-CM | POA: Diagnosis not present

## 2016-03-03 NOTE — Progress Notes (Signed)
Medical Nutrition Therapy: Visit start time: 1620  end time: 1720 Assessment:  Diagnosis: Vitamin B deficency ( on the low side of normal range) Past medical history: history of anemia Psychosocial issues/ stress concerns: none identified Preferred learning method:  [place] . Hands-on  Current weight: 170 lbs  Height: 64.5 in Medications, supplements: see list Progress and evaluation:  Patient came as a self- referral for nutrition counseling, 02/05/16, and decided she wanted to get a referral for medical nutrition therapy. She came today for her 1st medical nutrition therapy appointment. Her most recent lab work indicated that her B12 and Vitamin D are on the low side of the normal range. She reports that she has not been consistent in taking her multi-vitamin (that contains B12). She also does not drink milk which limits her Vitamin D intake. She has followed through to decrease sweetened beverages. She is no longer drinking the 20 oz pepsi at lunch; drinks water instead. She drinks 1 (10oz) glass of sweet tea with her dinner meal. She is taking some of the lower calorie microwave dinners for lunch but has not been adding any fruits or additional vegetables. She is using the fork method with salad dressing and states that does decrease her intake.  She states she eats her dinner meal quickly and has not been as aware of her portions as she should be.  Her present diet is low in fruits, vegetables and whole grains. Her diet is also inadequate in calcium and Vit.D sources. Physical activity: exercise classes, weights, treadmill 3 days per week for 1 hour. Patient wears smart phone pedometer and walks 8,000 -12,000 steps per day.  Dietary Intake:  Usual eating pattern includes 3 meals and 1-2 snacks per day. Dining out frequency: 4 meals per week.  Breakfast: 8:00am- yogurt with granola with fruit or almonds and fruit Lunch: 12:00- Smart One microwave meal, water Snack: fruit; sometimes with  peanut butter Supper: 6-7:30pm- Ex. vegetable plate with black-eyed peas, sweet potato, fried okra or grilled chicken salad, 10 oz. Sweet tea Snack: usually no snack Beverages: water, sweet tea,   Nutrition Care Education: Basic nutrition: Reviewed food groups and servings needed to meet basic nutrient needs. Discussed practical ways to increase fruit/vegetable intake as well as calcium sources. B12: Gave and reviewed diet sources of B12. Discussed need to be consistent with multi-vitamin to increase B12 intake.   Nutritional Diagnosis:  NI-5.9.1 Inadequate vitamin intake As related to not consistently taking multi-vitamin containing vitamin B12 and lack of calcium sources especially fortified with Vit. D..  As evidenced by diet history and labs..  Intervention:  Consider taking a calcium supplement with Vitamin D that provides 500-600 mg/tablet or "chew". Drink 1 cup daily of orange juice fortified with calcium in addition to the yogurt daily. Be consistent with your multi-vitamin to increase B12 intake. Include at least 6 oz. Of protein foods including lean meats, eggs and cheese to also increase B12 intake. Add a fruit or vegetable or both to microwave dinner at lunch. Consider eating a snack in the afternoon that consists of either starch for fruit. Add a protein such as peanut butter or cheese or nuts to help decrease hunger at dinner.   Education Materials given:  . B12 food sources . Plate Planner . List of recommended multi-vitamin brands . Goals/ instructions  Learner/ who was taught:  . Patient  Level of understanding: . Partial understanding; needs review/ practice Learning barriers: . None  Willingness to learn/ readiness for change: .  Eager, change in progress  Monitoring and Evaluation:  Dietary intake, exercise, and body weight      follow up: 04/22/16 at 9:00

## 2016-03-03 NOTE — Patient Instructions (Addendum)
Consider taking a calcium supplement with Vitamin D that provides 500-600 mg/tablet or "chew". Drink 1 cup daily of orange juice fortified with calcium in addition to the yogurt daily. Be consistent with your multi-vitamin to increase B12 intake. Include at least 6 oz. Of protein foods including lean meats, eggs and cheese to also increase B12 intake. Add a fruit or vegetable or both to microwave dinner at lunch. Consider eating a snack in the afternoon that consists of either starch for fruit. Add a protein such as peanut butter or cheese or nuts.

## 2016-04-22 ENCOUNTER — Ambulatory Visit: Payer: 59 | Admitting: Dietician

## 2016-05-05 DIAGNOSIS — L700 Acne vulgaris: Secondary | ICD-10-CM | POA: Diagnosis not present

## 2016-05-05 DIAGNOSIS — B078 Other viral warts: Secondary | ICD-10-CM | POA: Diagnosis not present

## 2016-05-05 DIAGNOSIS — D485 Neoplasm of uncertain behavior of skin: Secondary | ICD-10-CM | POA: Diagnosis not present

## 2016-05-05 DIAGNOSIS — D2239 Melanocytic nevi of other parts of face: Secondary | ICD-10-CM | POA: Diagnosis not present

## 2016-05-05 DIAGNOSIS — Z79899 Other long term (current) drug therapy: Secondary | ICD-10-CM | POA: Diagnosis not present

## 2016-05-06 ENCOUNTER — Encounter: Payer: Self-pay | Admitting: Dietician

## 2016-06-04 DIAGNOSIS — B078 Other viral warts: Secondary | ICD-10-CM | POA: Diagnosis not present

## 2016-06-04 DIAGNOSIS — Z79899 Other long term (current) drug therapy: Secondary | ICD-10-CM | POA: Diagnosis not present

## 2016-06-04 DIAGNOSIS — L700 Acne vulgaris: Secondary | ICD-10-CM | POA: Diagnosis not present

## 2016-07-07 DIAGNOSIS — Z79899 Other long term (current) drug therapy: Secondary | ICD-10-CM | POA: Diagnosis not present

## 2016-07-07 DIAGNOSIS — L700 Acne vulgaris: Secondary | ICD-10-CM | POA: Diagnosis not present

## 2016-07-09 DIAGNOSIS — L700 Acne vulgaris: Secondary | ICD-10-CM | POA: Diagnosis not present

## 2016-07-09 DIAGNOSIS — Z79899 Other long term (current) drug therapy: Secondary | ICD-10-CM | POA: Diagnosis not present

## 2016-08-11 DIAGNOSIS — Z79899 Other long term (current) drug therapy: Secondary | ICD-10-CM | POA: Diagnosis not present

## 2016-08-11 DIAGNOSIS — L700 Acne vulgaris: Secondary | ICD-10-CM | POA: Diagnosis not present

## 2016-09-10 DIAGNOSIS — L659 Nonscarring hair loss, unspecified: Secondary | ICD-10-CM | POA: Diagnosis not present

## 2016-09-10 DIAGNOSIS — L700 Acne vulgaris: Secondary | ICD-10-CM | POA: Diagnosis not present

## 2016-09-10 DIAGNOSIS — L99 Other disorders of skin and subcutaneous tissue in diseases classified elsewhere: Secondary | ICD-10-CM | POA: Diagnosis not present

## 2016-09-10 DIAGNOSIS — Z79899 Other long term (current) drug therapy: Secondary | ICD-10-CM | POA: Diagnosis not present

## 2016-10-13 DIAGNOSIS — L700 Acne vulgaris: Secondary | ICD-10-CM | POA: Diagnosis not present

## 2016-10-13 DIAGNOSIS — Z79899 Other long term (current) drug therapy: Secondary | ICD-10-CM | POA: Diagnosis not present

## 2016-11-12 DIAGNOSIS — L700 Acne vulgaris: Secondary | ICD-10-CM | POA: Diagnosis not present

## 2016-11-12 DIAGNOSIS — Z79899 Other long term (current) drug therapy: Secondary | ICD-10-CM | POA: Diagnosis not present

## 2016-11-20 ENCOUNTER — Telehealth: Payer: 59 | Admitting: Physician Assistant

## 2016-11-20 DIAGNOSIS — H1030 Unspecified acute conjunctivitis, unspecified eye: Secondary | ICD-10-CM

## 2016-11-20 MED ORDER — POLYMYXIN B-TRIMETHOPRIM 10000-0.1 UNIT/ML-% OP SOLN: OPHTHALMIC | 0 refills | Status: DC

## 2016-11-20 NOTE — Progress Notes (Signed)

## 2016-12-10 DIAGNOSIS — H43313 Vitreous membranes and strands, bilateral: Secondary | ICD-10-CM | POA: Diagnosis not present

## 2016-12-10 DIAGNOSIS — H5213 Myopia, bilateral: Secondary | ICD-10-CM | POA: Diagnosis not present

## 2016-12-15 DIAGNOSIS — Z79899 Other long term (current) drug therapy: Secondary | ICD-10-CM | POA: Diagnosis not present

## 2016-12-15 DIAGNOSIS — L700 Acne vulgaris: Secondary | ICD-10-CM | POA: Diagnosis not present

## 2016-12-24 ENCOUNTER — Other Ambulatory Visit: Payer: Self-pay | Admitting: Family Medicine

## 2016-12-24 ENCOUNTER — Ambulatory Visit
Admission: RE | Admit: 2016-12-24 | Discharge: 2016-12-24 | Disposition: A | Discharge status: Home / Self Care | Payer: 59 | Source: Ambulatory Visit | Attending: Family Medicine | Admitting: Family Medicine

## 2016-12-24 DIAGNOSIS — S6991XA Unspecified injury of right wrist, hand and finger(s), initial encounter: Secondary | ICD-10-CM | POA: Diagnosis not present

## 2016-12-24 DIAGNOSIS — S63681A Other sprain of right thumb, initial encounter: Secondary | ICD-10-CM | POA: Insufficient documentation

## 2016-12-24 DIAGNOSIS — X58XXXA Exposure to other specified factors, initial encounter: Secondary | ICD-10-CM | POA: Insufficient documentation

## 2016-12-24 DIAGNOSIS — M79644 Pain in right finger(s): Secondary | ICD-10-CM | POA: Diagnosis not present

## 2016-12-24 DIAGNOSIS — T148XXA Other injury of unspecified body region, initial encounter: Secondary | ICD-10-CM

## 2017-01-01 DIAGNOSIS — S63641A Sprain of metacarpophalangeal joint of right thumb, initial encounter: Secondary | ICD-10-CM | POA: Diagnosis not present

## 2017-01-15 DIAGNOSIS — Z79899 Other long term (current) drug therapy: Secondary | ICD-10-CM | POA: Diagnosis not present

## 2017-01-15 DIAGNOSIS — L700 Acne vulgaris: Secondary | ICD-10-CM | POA: Diagnosis not present

## 2017-02-02 ENCOUNTER — Encounter: Payer: Self-pay | Admitting: Family Medicine

## 2017-02-02 ENCOUNTER — Ambulatory Visit (INDEPENDENT_AMBULATORY_CARE_PROVIDER_SITE_OTHER): Payer: 59 | Admitting: Family Medicine

## 2017-02-02 DIAGNOSIS — R5383 Other fatigue: Secondary | ICD-10-CM

## 2017-02-02 DIAGNOSIS — L659 Nonscarring hair loss, unspecified: Secondary | ICD-10-CM | POA: Diagnosis not present

## 2017-02-02 LAB — CBC WITH DIFFERENTIAL/PLATELET
BASOS ABS: 0.1 10*3/uL (ref 0.0–0.1)
Basophils Relative: 0.5 % (ref 0.0–3.0)
EOS PCT: 0.4 % (ref 0.0–5.0)
Eosinophils Absolute: 0 10*3/uL (ref 0.0–0.7)
HEMATOCRIT: 42.4 % (ref 36.0–46.0)
Hemoglobin: 15 g/dL (ref 12.0–15.0)
LYMPHS ABS: 1.9 10*3/uL (ref 0.7–4.0)
LYMPHS PCT: 20.2 % (ref 12.0–46.0)
MCHC: 35.4 g/dL (ref 30.0–36.0)
MCV: 85.5 fl (ref 78.0–100.0)
MONOS PCT: 5.9 % (ref 3.0–12.0)
Monocytes Absolute: 0.5 10*3/uL (ref 0.1–1.0)
NEUTROS ABS: 6.8 10*3/uL (ref 1.4–7.7)
NEUTROS PCT: 73 % (ref 43.0–77.0)
PLATELETS: 273 10*3/uL (ref 150.0–400.0)
RBC: 4.96 Mil/uL (ref 3.87–5.11)
RDW: 13.4 % (ref 11.5–15.5)
WBC: 9.2 10*3/uL (ref 4.0–10.5)

## 2017-02-02 LAB — COMPREHENSIVE METABOLIC PANEL
ALT: 13 U/L (ref 0–35)
AST: 16 U/L (ref 0–37)
Albumin: 4.2 g/dL (ref 3.5–5.2)
Alkaline Phosphatase: 90 U/L (ref 39–117)
BUN: 11 mg/dL (ref 6–23)
CHLORIDE: 102 meq/L (ref 96–112)
CO2: 29 meq/L (ref 19–32)
CREATININE: 0.69 mg/dL (ref 0.40–1.20)
Calcium: 9.6 mg/dL (ref 8.4–10.5)
GFR: 101.42 mL/min (ref >60.00)
Glucose, Bld: 102 mg/dL — ABNORMAL HIGH (ref 70–99)
POTASSIUM: 4.3 meq/L (ref 3.5–5.1)
SODIUM: 137 meq/L (ref 135–145)
Total Bilirubin: 0.6 mg/dL (ref 0.2–1.2)
Total Protein: 7 g/dL (ref 6.0–8.3)

## 2017-02-02 LAB — VITAMIN D 25 HYDROXY (VIT D DEFICIENCY, FRACTURES): VITD: 37.84 ng/mL (ref 30.00–100.00)

## 2017-02-02 LAB — VITAMIN B12: VITAMIN B 12: 232 pg/mL (ref 211–911)

## 2017-02-02 LAB — TSH: TSH: 1.37 u[IU]/mL (ref 0.35–4.50)

## 2017-02-02 NOTE — Progress Notes (Signed)
Subjective:   Patient ID: MORENA MCKISSACK, female    DOB: Apr 05, 1981, 36 y.o.   MRN: [id number]  ANNETH BRUNELL is a pleasant 36 y.o. year old female who presents to clinic today with Fatigue (For several months) and Alopecia (For several months)  on 02/02/2017  HPI:  She has been noticing for past 3 months that she doesn't feel well rested.  Tired during the day. Feels she is sleeping well. Not snoring.  Does not feel depressed or anxious.  PMH of anemia but now she is no longer having periods with her IUD so she feels anemia should no longer be an issue. Lab Results  Component Value Date   WBC 9.9 02/05/2016   HGB 14.8 02/05/2016   HCT 43.0 02/05/2016   MCV 86.5 02/05/2016   PLT 277.0 02/05/2016   Lab Results  Component Value Date   NA 137 02/05/2016   K 3.8 02/05/2016   CL 103 02/05/2016   CO2 29 02/05/2016   Lab Results  Component Value Date   VITAMINB12 253 02/05/2016     Current Outpatient Prescriptions on File Prior to Visit  Medication Sig Dispense Refill  . levonorgestrel (MIRENA) 20 MCG/24HR IUD 1 each by Intrauterine route once.     No current facility-administered medications on file prior to visit.     No Known Allergies  No past medical history on file.  No past surgical history on file.  No family history on file.  Social History   Social History  . Marital status: Married    Spouse name: N/A  . Number of children: N/A  . Years of education: N/A   Occupational History  . Not on file.   Social History Main Topics  . Smoking status: Former Research scientist (life sciences)  . Smokeless tobacco: Never Used  . Alcohol use No  . Drug use: No  . Sexual activity: Not on file   Other Topics Concern  . Not on file   Social History Narrative  . No narrative on file   The PMH, PSH, Social History, Family History, Medications, and allergies have been reviewed in [facility], and have been updated if relevant.   Review of Systems  Constitutional: Positive for  fatigue.  HENT: Negative.   Respiratory: Negative.   Cardiovascular: Negative.   Gastrointestinal: Negative.   Endocrine: Negative.   Genitourinary: Negative.   Musculoskeletal: Negative.   Allergic/Immunologic: Negative.   Neurological: Negative.   Hematological: Negative.   Psychiatric/Behavioral: Negative.   All other systems reviewed and are negative.      Objective:    BP 110/70 (BP Location: Left Arm, Patient Position: Sitting, Cuff Size: Normal)   Pulse 80   Temp 98.1 F (36.7 C) (Oral)   Wt 163 lb (73.9 kg)   SpO2 98%   BMI 27.55 kg/m    Physical Exam   General:  Well-developed,well-nourished,in no acute distress; alert,appropriate and cooperative throughout examination Head:  normocephalic and atraumatic.   Eyes:  vision grossly intact, PERRL Ears:  R ear normal and L ear normal externally, TMs clear bilaterally Nose:  no external deformity.   Mouth:  good dentition.   Neck:  No deformities, masses, or tenderness noted.  Lungs:  Normal respiratory effort, chest expands symmetrically. Lungs are clear to auscultation, no crackles or wheezes. Heart:  Normal rate and regular rhythm. S1 and S2 normal without gallop, murmur, click, rub or other extra sounds. Abdomen:  Bowel sounds positive,abdomen soft and non-tender without masses, organomegaly or hernias  noted. Msk:  No deformity or scoliosis noted of thoracic or lumbar spine.   Extremities:  No clubbing, cyanosis, edema, or deformity noted with normal full range of motion of all joints.   Neurologic:  alert & oriented X3 and gait normal.   Skin:  Intact without suspicious lesions or rashes Cervical Nodes:  No lymphadenopathy noted Axillary Nodes:  No palpable lymphadenopathy Psych:  Cognition and judgment appear intact. Alert and cooperative with normal attention span and concentration. No apparent delusions, illusions, hallucinations      Assessment & Plan:   Other fatigue - Plan: CBC with  Differential/Platelet, Comprehensive metabolic panel, TSH, Vitamin B12, Vitamin D, 25-hydroxy  Alopecia No Follow-up on file.

## 2017-02-02 NOTE — Assessment & Plan Note (Signed)
Etiology unclear. Will start work up with labs today. The patient indicates understanding of these issues and agrees with the plan.  Orders Placed This Encounter  Procedures  . CBC with Differential/Platelet  . Comprehensive metabolic panel  . TSH  . Vitamin B12  . Vitamin D, 25-hydroxy

## 2017-02-02 NOTE — Progress Notes (Signed)
Pre visit review using our clinic review tool, if applicable. No additional management support is needed unless otherwise documented below in the visit note. 

## 2017-02-16 DIAGNOSIS — Z79899 Other long term (current) drug therapy: Secondary | ICD-10-CM | POA: Diagnosis not present

## 2017-02-16 DIAGNOSIS — L700 Acne vulgaris: Secondary | ICD-10-CM | POA: Diagnosis not present

## 2017-03-18 DIAGNOSIS — L700 Acne vulgaris: Secondary | ICD-10-CM | POA: Diagnosis not present

## 2017-03-18 DIAGNOSIS — Z79899 Other long term (current) drug therapy: Secondary | ICD-10-CM | POA: Diagnosis not present

## 2017-03-23 ENCOUNTER — Other Ambulatory Visit: Payer: Self-pay | Admitting: Family Medicine

## 2017-03-23 DIAGNOSIS — Z01419 Encounter for gynecological examination (general) (routine) without abnormal findings: Secondary | ICD-10-CM | POA: Insufficient documentation

## 2017-03-30 ENCOUNTER — Other Ambulatory Visit (INDEPENDENT_AMBULATORY_CARE_PROVIDER_SITE_OTHER): Payer: 59

## 2017-03-30 DIAGNOSIS — Z01419 Encounter for gynecological examination (general) (routine) without abnormal findings: Secondary | ICD-10-CM

## 2017-03-30 LAB — LIPID PANEL
CHOL/HDL RATIO: 4
Cholesterol: 148 mg/dL (ref 0–200)
HDL: 37.2 mg/dL — AB (ref >39.00)
LDL Cholesterol: 81 mg/dL (ref 0–99)
NONHDL: 110.66
TRIGLYCERIDES: 149 mg/dL (ref 0.0–149.0)
VLDL: 29.8 mg/dL (ref 0.0–40.0)

## 2017-03-30 LAB — COMPREHENSIVE METABOLIC PANEL
ALK PHOS: 96 U/L (ref 39–117)
ALT: 11 U/L (ref 0–35)
AST: 16 U/L (ref 0–37)
Albumin: 4.2 g/dL (ref 3.5–5.2)
BILIRUBIN TOTAL: 0.6 mg/dL (ref 0.2–1.2)
BUN: 9 mg/dL (ref 6–23)
CO2: 30 mEq/L (ref 19–32)
CREATININE: 0.72 mg/dL (ref 0.40–1.20)
Calcium: 9.1 mg/dL (ref 8.4–10.5)
Chloride: 103 mEq/L (ref 96–112)
GFR: 96.48 mL/min (ref >60.00)
Glucose, Bld: 102 mg/dL — ABNORMAL HIGH (ref 70–99)
Potassium: 4.2 mEq/L (ref 3.5–5.1)
Sodium: 138 mEq/L (ref 135–145)
TOTAL PROTEIN: 7.1 g/dL (ref 6.0–8.3)

## 2017-03-30 LAB — CBC WITH DIFFERENTIAL/PLATELET
BASOS PCT: 0.5 % (ref 0.0–3.0)
Basophils Absolute: 0 10*3/uL (ref 0.0–0.1)
EOS PCT: 1.1 % (ref 0.0–5.0)
Eosinophils Absolute: 0.1 10*3/uL (ref 0.0–0.7)
HCT: 43.5 % (ref 36.0–46.0)
Hemoglobin: 15.3 g/dL — ABNORMAL HIGH (ref 12.0–15.0)
LYMPHS ABS: 1.7 10*3/uL (ref 0.7–4.0)
Lymphocytes Relative: 19.6 % (ref 12.0–46.0)
MCHC: 35 g/dL (ref 30.0–36.0)
MCV: 85.8 fl (ref 78.0–100.0)
MONO ABS: 0.5 10*3/uL (ref 0.1–1.0)
MONOS PCT: 6.1 % (ref 3.0–12.0)
NEUTROS ABS: 6.3 10*3/uL (ref 1.4–7.7)
NEUTROS PCT: 72.7 % (ref 43.0–77.0)
PLATELETS: 271 10*3/uL (ref 150.0–400.0)
RBC: 5.08 Mil/uL (ref 3.87–5.11)
RDW: 13.1 % (ref 11.5–15.5)
WBC: 8.7 10*3/uL (ref 4.0–10.5)

## 2017-03-30 LAB — TSH: TSH: 1.8 u[IU]/mL (ref 0.35–4.50)

## 2017-04-01 ENCOUNTER — Ambulatory Visit (INDEPENDENT_AMBULATORY_CARE_PROVIDER_SITE_OTHER): Payer: 59 | Admitting: Family Medicine

## 2017-04-01 ENCOUNTER — Encounter: Payer: Self-pay | Admitting: Family Medicine

## 2017-04-01 VITALS — BP 108/64 | HR 74 | Temp 98.2°F | Ht 65.0 in | Wt 162.1 lb

## 2017-04-01 DIAGNOSIS — Z23 Encounter for immunization: Secondary | ICD-10-CM | POA: Diagnosis not present

## 2017-04-01 DIAGNOSIS — Z01419 Encounter for gynecological examination (general) (routine) without abnormal findings: Secondary | ICD-10-CM

## 2017-04-01 NOTE — Assessment & Plan Note (Signed)
Reviewed preventive care protocols, scheduled due services, and updated immunizations Discussed nutrition, exercise, diet, and healthy lifestyle.  

## 2017-04-01 NOTE — Addendum Note (Signed)
Addended by: Jacqualin Combes on: 04/01/2017 08:31 AM   Modules accepted: Orders

## 2017-04-01 NOTE — Progress Notes (Signed)
   Subjective:    Patient ID: Leslie Jordan, female    DOB: 12-03-1980, 36 y.o.   MRN: [id number]  HPI  36 yo pleasant G3P3 here for CPX.  No h/o abnormal pap smears. Last pap smear 12/13/14. Sexually active with husband only. Denies any vaginal discharge, lesions or dysuria.  s/p BTL. She also has IUD which was placed in 09/2015.  Maternal grandmother breast CA in er 48s- baseline mammogram neg 01/21/15  Anemia-  Since IUD placed, periods have stopped but she is still fatigued.   Lab Results  Component Value Date   CHOL 148 03/30/2017   HDL 37.20 (L) 03/30/2017   LDLCALC 81 03/30/2017   TRIG 149.0 03/30/2017   CHOLHDL 4 03/30/2017    Lab Results  Component Value Date   WBC 8.7 03/30/2017   HGB 15.3 (H) 03/30/2017   HCT 43.5 03/30/2017   MCV 85.8 03/30/2017   PLT 271.0 03/30/2017   Lab Results  Component Value Date   CREATININE 0.72 03/30/2017   Lab Results  Component Value Date   NA 138 03/30/2017   K 4.2 03/30/2017   CL 103 03/30/2017   CO2 30 03/30/2017     Review of Systems  Constitutional: Positive for fatigue.  HENT: Negative.   Eyes: Negative.   Respiratory: Negative.   Cardiovascular: Negative.   Gastrointestinal: Negative.   Endocrine: Negative.   Genitourinary: Negative.   Musculoskeletal: Negative.   Skin: Negative.   Allergic/Immunologic: Negative.   Neurological: Negative.   Hematological: Negative.   Psychiatric/Behavioral: Negative.         Objective:   Physical Exam BP 108/64   Pulse 74   Temp 98.2 F (36.8 C) (Oral)   Ht 5\' 5"  (1.651 m)   Wt 162 lb 1.9 oz (73.5 kg)   SpO2 98%   BMI 26.98 kg/m  Wt Readings from Last 3 Encounters:  04/01/17 162 lb 1.9 oz (73.5 kg)  02/02/17 163 lb (73.9 kg)  03/03/16 170 lb (77.1 kg)    General:  Well-developed,well-nourished,in no acute distress; alert,appropriate and cooperative throughout examination Head:  normocephalic and atraumatic.   Eyes:  vision grossly intact, pupils  equal, pupils round, and pupils reactive to light.   Ears:  R ear normal and L ear normal.   Nose:  no external deformity.   Mouth:  good dentition.   Neck:  No deformities, masses, or tenderness noted. Lungs:  Normal respiratory effort, chest expands symmetrically. Lungs are clear to auscultation, no crackles or wheezes. Heart:  Normal rate and regular rhythm. S1 and S2 normal without gallop, murmur, click, rub or other extra sounds. Abdomen:  Bowel sounds positive,abdomen soft and non-tender without masses, organomegaly or hernias noted. Msk:  No deformity or scoliosis noted of thoracic or lumbar spine.   Extremities:  No clubbing, cyanosis, edema, or deformity noted with normal full range of motion of all joints.   Neurologic:  alert & oriented X3 and gait normal.   Skin:  Intact without suspicious lesions or rashes Cervical Nodes:  No lymphadenopathy noted Axillary Nodes:  No palpable lymphadenopathy Psych:  Cognition and judgment appear intact. Alert and cooperative with normal attention span and concentration. No apparent delusions, illusions, hallucinations        Assessment & Plan:

## 2017-11-28 ENCOUNTER — Telehealth: Payer: 59 | Admitting: Family

## 2017-11-28 DIAGNOSIS — J111 Influenza due to unidentified influenza virus with other respiratory manifestations: Secondary | ICD-10-CM

## 2017-11-28 MED ORDER — OSELTAMIVIR PHOSPHATE 75 MG PO CAPS: 75.0000 mg | ORAL_CAPSULE | Freq: Two times a day (BID) | ORAL | 0 refills | Status: DC

## 2017-11-28 NOTE — Progress Notes (Signed)
Thank you for the details you included in the comment boxes. Those details are very helpful in determining the best course of treatment for you and help us to provide the best care.  E visit for Flu like symptoms   We are sorry that you are not feeling well.  Here is how we plan to help! Based on what you have shared with me it looks like you may have possible exposure to a virus that causes influenza.  Influenza or "the flu" is   an infection caused by a respiratory virus. The flu virus is highly contagious and persons who did not receive their yearly flu vaccination may "catch" the flu from close contact.  We have anti-viral medications to treat the viruses that cause this infection. They are not a "cure" and only shorten the course of the infection. These prescriptions are most effective when they are given within the first 2 days of "flu" symptoms. Antiviral medication are indicated if you have a high risk of complications from the flu. You should  also consider an antiviral medication if you are in close contact with someone who is at risk. These medications can help patients avoid complications from the flu  but have side effects that you should know. Possible side effects from Tamiflu or oseltamivir include nausea, vomiting, diarrhea, dizziness, headaches, eye redness, sleep problems or other respiratory symptoms. You should not take Tamiflu if you have an allergy to oseltamivir or any to the ingredients in Tamiflu.  Based upon your symptoms and potential risk factors I have prescribed Oseltamivir (Tamiflu).  It has been sent to your designated pharmacy.  You will take one 75 mg capsule orally twice a day for the next 5 days.  ANYONE WHO HAS FLU SYMPTOMS SHOULD: . Stay home. The flu is highly contagious and going out or to work exposes others! . Be sure to drink plenty of fluids. Water is fine as well as fruit juices, sodas and electrolyte beverages. You may want to stay away from caffeine or  alcohol. If you are nauseated, try taking small sips of liquids. How do you know if you are getting enough fluid? Your urine should be a pale yellow or almost colorless. . Get rest. . Taking a steamy shower or using a humidifier may help nasal congestion and ease sore throat pain. Using a saline nasal spray works much the same way. . Cough drops, hard candies and sore throat lozenges may ease your cough. . Line up a caregiver. Have someone check on you regularly.   GET HELP RIGHT AWAY IF: . You cannot keep down liquids or your medications. . You become short of breath . Your fell like you are going to pass out or loose consciousness. . Your symptoms persist after you have completed your treatment plan MAKE SURE YOU   Understand these instructions.  Will watch your condition.  Will get help right away if you are not doing well or get worse.  Your e-visit answers were reviewed by a board certified advanced clinical practitioner to complete your personal care plan.  Depending on the condition, your plan could have included both over the counter or prescription medications.  If there is a problem please reply  once you have received a response from your provider.  Your safety is important to us.  If you have drug allergies check your prescription carefully.    You can use MyChart to ask questions about today's visit, request a non-urgent call back, or ask   for a work or school excuse for 24 hours related to this e-Visit. If it has been greater than 24 hours you will need to follow up with your provider, or enter a new e-Visit to address those concerns.  You will get an e-mail in the next two days asking about your experience.  I hope that your e-visit has been valuable and will speed your recovery. Thank you for using e-visits.   

## 2018-01-04 ENCOUNTER — Encounter: Payer: Self-pay | Admitting: Primary Care

## 2018-01-04 ENCOUNTER — Ambulatory Visit: Payer: 59 | Admitting: Primary Care

## 2018-01-04 VITALS — BP 114/70 | HR 75 | Temp 98.0°F | Ht 65.0 in | Wt 172.8 lb

## 2018-01-04 DIAGNOSIS — D509 Iron deficiency anemia, unspecified: Secondary | ICD-10-CM | POA: Diagnosis not present

## 2018-01-04 DIAGNOSIS — Z0000 Encounter for general adult medical examination without abnormal findings: Secondary | ICD-10-CM

## 2018-01-04 NOTE — Assessment & Plan Note (Addendum)
Repeat CBC pending. Denies heavy vaginal bleeding, rectal bleeding.

## 2018-01-04 NOTE — Progress Notes (Signed)
Subjective:    Patient ID: Leslie Jordan, female    DOB: 09-Mar-1981, 37 y.o.   MRN: [id number]  HPI  Leslie Jordan is a 37 year old female who presents today to transfer care from Dr. Deborra Medina.  1) Acne Vulgaris: Currently managed on isotretinoin 20 mg. Currently following with dermatology. She feels well managed.   2) Asthma: Uses albuterol inhaler once every few months, sometimes with exercise and sometimes with seasonal changes. She doesn't need a refill today.  Immunizations: -Tetanus: Completed in 2018 -Influenza: Completed this season   Diet: She endorses a poor diet.  Breakfast: Nuts, shake, yogurt Lunch: Sandwich, chips, freezer meals, protein bowl Dinner: Chicken, vegetables, starch  Snacks: Pretzels, peanut butter, cheese, fruit Desserts: Occasional Beverages: Regular soda, sweet tea, water  Exercise: She has a Physiological scientist twice weekly, some  Eye exam: Scheduled in March 2019 Dental exam: Completed today. Pap Smear: Follows with GYN Mammogram: Completed several years ago. Negative   Review of Systems  Constitutional: Negative for unexpected weight change.  HENT: Negative for rhinorrhea.   Respiratory: Negative for cough and shortness of breath.   Cardiovascular: Negative for chest pain.  Gastrointestinal: Negative for constipation and diarrhea.  Genitourinary: Negative for difficulty urinating and menstrual problem.  Musculoskeletal: Negative for arthralgias and myalgias.  Skin: Negative for rash.       Following with dermatology for acne  Allergic/Immunologic: Positive for environmental allergies.  Neurological: Negative for dizziness, numbness and headaches.  Psychiatric/Behavioral:       Denies concerns for anxiety and depression        No past medical history on file.   Social History   Socioeconomic History  . Marital status: Married    Spouse name: Not on file  . Number of children: Not on file  . Years of education: Not on file  .  Highest education level: Not on file  Social Needs  . Financial resource strain: Not on file  . Food insecurity - worry: Not on file  . Food insecurity - inability: Not on file  . Transportation needs - medical: Not on file  . Transportation needs - non-medical: Not on file  Occupational History  . Not on file  Tobacco Use  . Smoking status: Former Research scientist (life sciences)  . Smokeless tobacco: Never Used  Substance and Sexual Activity  . Alcohol use: No  . Drug use: No  . Sexual activity: Not on file  Other Topics Concern  . Not on file  Social History Narrative  . Not on file    No family history on file.  No Known Allergies  Current Outpatient Medications on File Prior to Visit  Medication Sig Dispense Refill  . albuterol (PROAIR HFA) 108 (90 Base) MCG/ACT inhaler Inhale 2 puffs into the lungs every 6 (six) hours as needed.     . ISOtretinoin (MYORISAN) 20 MG capsule Take 1 capsule by mouth daily.    Marland Kitchen levonorgestrel (MIRENA) 20 MCG/24HR IUD 1 each by Intrauterine route once.     No current facility-administered medications on file prior to visit.     BP 114/70   Pulse 75   Temp 98 F (36.7 C) (Oral)   Ht 5\' 5"  (1.651 m)   Wt 172 lb 12 oz (78.4 kg)   SpO2 98%   BMI 28.75 kg/m    Objective:   Physical Exam  Constitutional: She is oriented to person, place, and time. She appears well-nourished.  HENT:  Right Ear: Tympanic membrane and  ear canal normal.  Left Ear: Tympanic membrane and ear canal normal.  Nose: Nose normal.  Mouth/Throat: Oropharynx is clear and moist.  Eyes: Conjunctivae and EOM are normal. Pupils are equal, round, and reactive to light.  Neck: Neck supple. No thyromegaly present.  Cardiovascular: Normal rate and regular rhythm.  No murmur heard. Pulmonary/Chest: Effort normal and breath sounds normal. She has no rales.  Abdominal: Soft. Bowel sounds are normal. There is no tenderness.  Musculoskeletal: Normal range of motion.  Lymphadenopathy:    She has  no cervical adenopathy.  Neurological: She is alert and oriented to person, place, and time. She has normal reflexes. No cranial nerve deficit.  Skin: Skin is warm and dry. No rash noted.  Psychiatric: She has a normal mood and affect.          Assessment & Plan:

## 2018-01-04 NOTE — Patient Instructions (Addendum)
Continue exercising. You should be getting 150 minutes of moderate intensity exercise weekly.  It's important to improve your diet by reducing consumption of fast food, fried food, processed snack foods, sugary drinks. Increase consumption of fresh vegetables and fruits, whole grains, water.  Ensure you are drinking 64 ounces of water daily.  Schedule a lab only appointment to return for fasting labs. No food 8 hours prior. You may have water an black coffee.   Follow up in 1 year for your annual exam or sooner if needed.  It was a pleasure to see you today!   Preventive Care 18-39 Years, Female Preventive care refers to lifestyle choices and visits with your health care provider that can promote health and wellness. What does preventive care include?  A yearly physical exam. This is also called an annual well check.  Dental exams once or twice a year.  Routine eye exams. Ask your health care provider how often you should have your eyes checked.  Personal lifestyle choices, including: ? Daily care of your teeth and gums. ? Regular physical activity. ? Eating a healthy diet. ? Avoiding tobacco and drug use. ? Limiting alcohol use. ? Practicing safe sex. ? Taking vitamin and mineral supplements as recommended by your health care provider. What happens during an annual well check? The services and screenings done by your health care provider during your annual well check will depend on your age, overall health, lifestyle risk factors, and family history of disease. Counseling Your health care provider may ask you questions about your:  Alcohol use.  Tobacco use.  Drug use.  Emotional well-being.  Home and relationship well-being.  Sexual activity.  Eating habits.  Work and work Statistician.  Method of birth control.  Menstrual cycle.  Pregnancy history.  Screening You may have the following tests or measurements:  Height, weight, and BMI.  Diabetes  screening. This is done by checking your blood sugar (glucose) after you have not eaten for a while (fasting).  Blood pressure.  Lipid and cholesterol levels. These may be checked every 5 years starting at age 82.  Skin check.  Hepatitis C blood test.  Hepatitis B blood test.  Sexually transmitted disease (STD) testing.  BRCA-related cancer screening. This may be done if you have a family history of breast, ovarian, tubal, or peritoneal cancers.  Pelvic exam and Pap test. This may be done every 3 years starting at age 19. Starting at age 87, this may be done every 5 years if you have a Pap test in combination with an HPV test.  Discuss your test results, treatment options, and if necessary, the need for more tests with your health care provider. Vaccines Your health care provider may recommend certain vaccines, such as:  Influenza vaccine. This is recommended every year.  Tetanus, diphtheria, and acellular pertussis (Tdap, Td) vaccine. You may need a Td booster every 10 years.  Varicella vaccine. You may need this if you have not been vaccinated.  HPV vaccine. If you are 44 or younger, you may need three doses over 6 months.  Measles, mumps, and rubella (MMR) vaccine. You may need at least one dose of MMR. You may also need a second dose.  Pneumococcal 13-valent conjugate (PCV13) vaccine. You may need this if you have certain conditions and were not previously vaccinated.  Pneumococcal polysaccharide (PPSV23) vaccine. You may need one or two doses if you smoke cigarettes or if you have certain conditions.  Meningococcal vaccine. One dose is recommended  if you are age 64-21 years and a first-year college student living in a residence hall, or if you have one of several medical conditions. You may also need additional booster doses.  Hepatitis A vaccine. You may need this if you have certain conditions or if you travel or work in places where you may be exposed to hepatitis  A.  Hepatitis B vaccine. You may need this if you have certain conditions or if you travel or work in places where you may be exposed to hepatitis B.  Haemophilus influenzae type b (Hib) vaccine. You may need this if you have certain risk factors.  Talk to your health care provider about which screenings and vaccines you need and how often you need them. This information is not intended to replace advice given to you by your health care provider. Make sure you discuss any questions you have with your health care provider. Document Released: 12/30/2001 Document Revised: 07/23/2016 Document Reviewed: 09/04/2015 Elsevier Interactive Patient Education  [employer].

## 2018-01-04 NOTE — Assessment & Plan Note (Signed)
Immunizations UTD. Pap UTD, follows with GYN. Discussed the importance of a healthy diet and regular exercise in order for weight loss, and to reduce the risk of any potential medical problems. Exam unremarkable. Labs pending, she'll return fasting for labs.  Follow up in 1 year.

## 2018-01-05 ENCOUNTER — Other Ambulatory Visit (INDEPENDENT_AMBULATORY_CARE_PROVIDER_SITE_OTHER): Payer: 59

## 2018-01-05 DIAGNOSIS — D509 Iron deficiency anemia, unspecified: Secondary | ICD-10-CM

## 2018-01-05 DIAGNOSIS — R7309 Other abnormal glucose: Secondary | ICD-10-CM

## 2018-01-05 DIAGNOSIS — Z0000 Encounter for general adult medical examination without abnormal findings: Secondary | ICD-10-CM

## 2018-01-05 LAB — COMPREHENSIVE METABOLIC PANEL
ALBUMIN: 4.1 g/dL (ref 3.5–5.2)
ALK PHOS: 86 U/L (ref 39–117)
ALT: 12 U/L (ref 0–35)
AST: 17 U/L (ref 0–37)
BUN: 7 mg/dL (ref 6–23)
CALCIUM: 9.3 mg/dL (ref 8.4–10.5)
CHLORIDE: 100 meq/L (ref 96–112)
CO2: 32 mEq/L (ref 19–32)
CREATININE: 0.7 mg/dL (ref 0.40–1.20)
GFR: 99.26 mL/min (ref >60.00)
Glucose, Bld: 109 mg/dL — ABNORMAL HIGH (ref 70–99)
Potassium: 4 mEq/L (ref 3.5–5.1)
Sodium: 136 mEq/L (ref 135–145)
TOTAL PROTEIN: 7.1 g/dL (ref 6.0–8.3)
Total Bilirubin: 0.7 mg/dL (ref 0.2–1.2)

## 2018-01-05 LAB — LIPID PANEL
CHOLESTEROL: 165 mg/dL (ref 0–200)
HDL: 33.9 mg/dL — ABNORMAL LOW (ref >39.00)
LDL Cholesterol: 91 mg/dL (ref 0–99)
NonHDL: 130.93
TRIGLYCERIDES: 198 mg/dL — AB (ref 0.0–149.0)
Total CHOL/HDL Ratio: 5
VLDL: 39.6 mg/dL (ref 0.0–40.0)

## 2018-01-05 LAB — CBC
HEMATOCRIT: 43.3 % (ref 36.0–46.0)
Hemoglobin: 15.1 g/dL — ABNORMAL HIGH (ref 12.0–15.0)
MCHC: 35 g/dL (ref 30.0–36.0)
MCV: 85 fl (ref 78.0–100.0)
Platelets: 282 10*3/uL (ref 150.0–400.0)
RBC: 5.09 Mil/uL (ref 3.87–5.11)
RDW: 13.7 % (ref 11.5–15.5)
WBC: 5.5 10*3/uL (ref 4.0–10.5)

## 2018-01-05 LAB — HEMOGLOBIN A1C: HEMOGLOBIN A1C: 5.1 % (ref 4.6–6.5)

## 2018-03-19 NOTE — Progress Notes (Signed)
appt cancelled

## 2018-09-28 ENCOUNTER — Ambulatory Visit (INDEPENDENT_AMBULATORY_CARE_PROVIDER_SITE_OTHER): Payer: 59 | Admitting: Obstetrics and Gynecology

## 2018-09-28 ENCOUNTER — Encounter: Payer: Self-pay | Admitting: Obstetrics and Gynecology

## 2018-09-28 VITALS — BP 118/74 | Ht 65.0 in | Wt 182.0 lb

## 2018-09-28 DIAGNOSIS — T8339XA Other mechanical complication of intrauterine contraceptive device, initial encounter: Secondary | ICD-10-CM

## 2018-09-28 DIAGNOSIS — R102 Pelvic and perineal pain: Secondary | ICD-10-CM

## 2018-09-28 NOTE — Progress Notes (Signed)
Obstetrics & Gynecology Office Visit   Chief Complaint  Patient presents with  . Menstrual Problem  IUD issue   History of Present Illness: 37 y.o. G35P3003 female who presents with an issue with her IUD.  She has had no bleeding since the placement of her IUD in 02/2015.  She has had no issues at all.  Yesterday, she noted some cramping and spotting.  She thought she saw some tissue when she went to the restroom.  The cramping has been severe (she has been shaking and it is difficult to move).  Yesterday, the spotting was just on the tissue. Today, she has no bleeding.  She states that she has had no issues with intercourse so far. She and her husband had intercourse two night ago. However, they did not do anything out of the ordinary.  She does not check her IUD strings.   Past Medical History:  Diagnosis Date  . Acne vulgaris   . Iron deficiency anemia     Past Surgical History:  Procedure Laterality Date  . LAPAROSCOPIC CHOLECYSTECTOMY  2006  . TUBAL LIGATION  2009   after delivery    Gynecologic History: No LMP recorded. (Menstrual status: IUD).  Obstetric History: [id number]  Family History  Problem Relation Age of Onset  . Breast cancer Maternal Grandmother 52  . Ovarian cancer Maternal Aunt   . Uterine cancer Maternal Aunt     Social History   Socioeconomic History  . Marital status: Married    Spouse name: Not on file  . Number of children: Not on file  . Years of education: Not on file  . Highest education level: Not on file  Occupational History  . Not on file  Social Needs  . Financial resource strain: Not on file  . Food insecurity:    Worry: Not on file    Inability: Not on file  . Transportation needs:    Medical: Not on file    Non-medical: Not on file  Tobacco Use  . Smoking status: Former Research scientist (life sciences)  . Smokeless tobacco: Never Used  Substance and Sexual Activity  . Alcohol use: No  . Drug use: No  . Sexual activity: Yes    Birth  control/protection: IUD  Lifestyle  . Physical activity:    Days per week: Not on file    Minutes per session: Not on file  . Stress: Not on file  Relationships  . Social connections:    Talks on phone: Not on file    Gets together: Not on file    Attends religious service: Not on file    Active member of club or organization: Not on file    Attends meetings of clubs or organizations: Not on file    Relationship status: Not on file  . Intimate partner violence:    Fear of current or ex partner: Not on file    Emotionally abused: Not on file    Physically abused: Not on file    Forced sexual activity: Not on file  Other Topics Concern  . Not on file  Social History Narrative  . Not on file    No Known Allergies  Prior to Admission medications   Medication Sig Start Date End Date Taking? Authorizing Provider  albuterol (PROAIR HFA) 108 (90 Base) MCG/ACT inhaler Inhale 2 puffs into the lungs every 6 (six) hours as needed.  10/28/16  Yes [provider]  levonorgestrel (MIRENA) 20 MCG/24HR IUD 1 each by Intrauterine route once.  Yes [provider]  ISOtretinoin (MYORISAN) 20 MG capsule Take 1 capsule by mouth daily. 12/18/16   [provider]    Review of Systems  Constitutional: Negative.   HENT: Negative.   Eyes: Negative.   Respiratory: Negative.   Cardiovascular: Negative.   Gastrointestinal: Negative.   Genitourinary: Negative.        Unless noted in HPI  Musculoskeletal: Negative.   Skin: Negative.   Neurological: Negative.   Psychiatric/Behavioral: Negative.      Physical Exam BP 118/74   Ht 5\' 5"  (1.651 m)   Wt 182 lb (82.6 kg)   BMI 30.29 kg/m  No LMP recorded. (Menstrual status: IUD). Physical Exam  Constitutional: She is oriented to person, place, and time. She appears well-developed and well-nourished. No distress.  Genitourinary: Vagina normal and uterus normal. Pelvic exam was performed with patient supine. There is no  rash, tenderness, lesion or injury on the right labia. There is no rash, tenderness or injury on the left labia. Right adnexum does not display mass, does not display tenderness and does not display fullness. Left adnexum does not display mass, does not display tenderness and does not display fullness. Cervix does not exhibit motion tenderness, lesion, polyp or visible IUD strings.  HENT:  Head: Normocephalic and atraumatic.  Eyes: Conjunctivae are normal. No scleral icterus.  Abdominal: Soft. Bowel sounds are normal. She exhibits no distension and no mass. There is no tenderness. There is no rebound and no guarding.  Musculoskeletal: Normal range of motion. She exhibits no edema.  Neurological: She is alert and oriented to person, place, and time. No cranial nerve deficit.  Skin: Skin is warm and dry. No erythema.  Psychiatric: She has a normal mood and affect. Her behavior is normal. Judgment normal.   Female chaperone present for pelvic and breast  portions of the physical exam  Assessment: 37 y.o. G62P3003 female here for  1. Other mechanical complication of intrauterine contraceptive device, initial encounter      Plan: Problem List Items Addressed This Visit    None    Visit Diagnoses    Other mechanical complication of intrauterine contraceptive device, initial encounter    -  Primary   Relevant Orders   US PELVIS TRANSVANGINAL NON-OB (TV ONLY)     Follow up with pelvic ultrasound to verify location of IUD.   15 minutes spent in face to face discussion with > 50% spent in counseling,management, and coordination of care of her IUD mechanical complication.   Prentice Docker, MD  09/28/2018 5:06 PM

## 2018-09-30 ENCOUNTER — Other Ambulatory Visit: Payer: 59

## 2018-09-30 ENCOUNTER — Ambulatory Visit (INDEPENDENT_AMBULATORY_CARE_PROVIDER_SITE_OTHER): Payer: 59

## 2018-09-30 DIAGNOSIS — T8339XA Other mechanical complication of intrauterine contraceptive device, initial encounter: Secondary | ICD-10-CM | POA: Diagnosis not present

## 2018-10-05 ENCOUNTER — Telehealth: Payer: Self-pay | Admitting: Obstetrics and Gynecology

## 2018-10-05 NOTE — Telephone Encounter (Signed)
Noted. Will order to arrive by apt date/time. 

## 2018-10-05 NOTE — Telephone Encounter (Signed)
Patient is schedule 10/27/18 with [facility] for Mirena insertion

## 2018-10-07 ENCOUNTER — Other Ambulatory Visit: Payer: 59

## 2018-10-22 ENCOUNTER — Ambulatory Visit (INDEPENDENT_AMBULATORY_CARE_PROVIDER_SITE_OTHER): Payer: Managed Care, Other (non HMO) | Admitting: Primary Care

## 2018-10-22 ENCOUNTER — Encounter: Payer: Self-pay | Admitting: Primary Care

## 2018-10-22 VITALS — BP 118/80 | HR 90 | Temp 98.1°F | Ht 65.0 in | Wt 183.0 lb

## 2018-10-22 DIAGNOSIS — F419 Anxiety disorder, unspecified: Secondary | ICD-10-CM | POA: Diagnosis not present

## 2018-10-22 MED ORDER — HYDROXYZINE HCL 25 MG PO TABS: 25.0000 mg | ORAL_TABLET | Freq: Two times a day (BID) | ORAL | 0 refills | Status: DC | PRN

## 2018-10-22 NOTE — Patient Instructions (Signed)
You may take the hydroxyzine tablets 1-2 times daily as needed for anxiety. Start by taking this at bedtime as it will cause drowsiness.  Try some relaxing techniques to help with anxiety including meditation, reading, spending some time on yourself.   It was a pleasure to see you today!   Living With Anxiety After being diagnosed with an anxiety disorder, you may be relieved to know why you have felt or behaved a certain way. It is natural to also feel overwhelmed about the treatment ahead and what it will mean for your life. With care and support, you can manage this condition and recover from it. How to cope with anxiety Dealing with stress Stress is your body's reaction to life changes and events, both good and bad. Stress can last just a few hours or it can be ongoing. Stress can play a major role in anxiety, so it is important to learn both how to cope with stress and how to think about it differently. Talk with your health care provider or a counselor to learn more about stress reduction. He or she may suggest some stress reduction techniques, such as:  Music therapy. This can include creating or listening to music that you enjoy and that inspires you.  Mindfulness-based meditation. This involves being aware of your normal breaths, rather than trying to control your breathing. It can be done while sitting or walking.  Centering prayer. This is a kind of meditation that involves focusing on a word, phrase, or sacred image that is meaningful to you and that brings you peace.  Deep breathing. To do this, expand your stomach and inhale slowly through your nose. Hold your breath for 3-5 seconds. Then exhale slowly, allowing your stomach muscles to relax.  Self-talk. This is a skill where you identify thought patterns that lead to anxiety reactions and correct those thoughts.  Muscle relaxation. This involves tensing muscles then relaxing them.  Choose a stress reduction technique that  fits your lifestyle and personality. Stress reduction techniques take time and practice. Set aside 5-15 minutes a day to do them. Therapists can offer training in these techniques. The training may be covered by some insurance plans. Other things you can do to manage stress include:  Keeping a stress diary. This can help you learn what triggers your stress and ways to control your response.  Thinking about how you respond to certain situations. You may not be able to control everything, but you can control your reaction.  Making time for activities that help you relax, and not feeling guilty about spending your time in this way.  Therapy combined with coping and stress-reduction skills provides the best chance for successful treatment. Medicines Medicines can help ease symptoms. Medicines for anxiety include:  Anti-anxiety drugs.  Antidepressants.  Beta-blockers.  Medicines may be used as the main treatment for anxiety disorder, along with therapy, or if other treatments are not working. Medicines should be prescribed by a health care provider. Relationships Relationships can play a big part in helping you recover. Try to spend more time connecting with trusted friends and family members. Consider going to couples counseling, taking family education classes, or going to family therapy. Therapy can help you and others better understand the condition. How to recognize changes in your condition Everyone has a different response to treatment for anxiety. Recovery from anxiety happens when symptoms decrease and stop interfering with your daily activities at home or work. This may mean that you will start to:  Have better concentration and focus.  Sleep better.  Be less irritable.  Have more energy.  Have improved memory.  It is important to recognize when your condition is getting worse. Contact your health care provider if your symptoms interfere with home or work and you do not feel  like your condition is improving. Where to find help and support: You can get help and support from these sources:  Self-help groups.  Online and [employer].  A trusted spiritual leader.  Couples counseling.  Family education classes.  Family therapy.  Follow these instructions at home:  Eat a healthy diet that includes plenty of vegetables, fruits, whole grains, low-fat dairy products, and lean protein. Do not eat a lot of foods that are high in solid fats, added sugars, or salt.  Exercise. Most adults should do the following: ? Exercise for at least 150 minutes each week. The exercise should increase your heart rate and make you sweat (moderate-intensity exercise). ? Strengthening exercises at least twice a week.  Cut down on caffeine, tobacco, alcohol, and other potentially harmful substances.  Get the right amount and quality of sleep. Most adults need 7-9 hours of sleep each night.  Make choices that simplify your life.  Take over-the-counter and prescription medicines only as told by your health care provider.  Avoid caffeine, alcohol, and certain over-the-counter cold medicines. These may make you feel worse. Ask your pharmacist which medicines to avoid.  Keep all follow-up visits as told by your health care provider. This is important. Questions to ask your health care provider  Would I benefit from therapy?  How often should I follow up with a health care provider?  How long do I need to take medicine?  Are there any long-term side effects of my medicine?  Are there any alternatives to taking medicine? Contact a health care provider if:  You have a hard time staying focused or finishing daily tasks.  You spend many hours a day feeling worried about everyday life.  You become exhausted by worry.  You start to have headaches, feel tense, or have nausea.  You urinate more than normal.  You have diarrhea. Get help right away if:  You have  a racing heart and shortness of breath.  You have thoughts of hurting yourself or others. If you ever feel like you may hurt yourself or others, or have thoughts about taking your own life, get help right away. You can go to your nearest emergency department or call:  Your local emergency services (911 in the [place]  A suicide crisis helpline, such as the [facility] at (708) 512-1548. This is open 24-hours a day.  Summary  Taking steps to deal with stress can help calm you.  Medicines cannot cure anxiety disorders, but they can help ease symptoms.  Family, friends, and partners can play a big part in helping you recover from an anxiety disorder. This information is not intended to replace advice given to you by your health care provider. Make sure you discuss any questions you have with your health care provider. Document Released: 10/28/2016 Document Revised: 10/28/2016 Document Reviewed: 10/28/2016 Elsevier Interactive Patient Education  [employer].

## 2018-10-22 NOTE — Progress Notes (Signed)
Subjective:    Patient ID: Leslie Jordan, female    DOB: 1981/09/23, 37 y.o.   MRN: [id number]  HPI  Leslie Jordan is a 37 year old female who presents today with a chief complaint of anxiety.   She feels like her "plate is full", not sleeping well, feels nervous, feeling overwhelmed, daily worry, mind racing thoughts, palpitations. This began about 2 weeks ago and has been daily. She denies SI/HI, depression, any recent stress in her home or work life, any reason to cause these symptoms.   This has occurred about 4 years ago, lasted about 2 months total. Was once prescribed low dose Xanax for which she used as needed.   Review of Systems  Cardiovascular: Positive for palpitations.  Psychiatric/Behavioral: The patient is nervous/anxious.        See HPI       Past Medical History:  Diagnosis Date  . Acne vulgaris   . Iron deficiency anemia      Social History   Socioeconomic History  . Marital status: Married    Spouse name: Not on file  . Number of children: Not on file  . Years of education: Not on file  . Highest education level: Not on file  Occupational History  . Not on file  Social Needs  . Financial resource strain: Not on file  . Food insecurity:    Worry: Not on file    Inability: Not on file  . Transportation needs:    Medical: Not on file    Non-medical: Not on file  Tobacco Use  . Smoking status: Former Research scientist (life sciences)  . Smokeless tobacco: Never Used  Substance and Sexual Activity  . Alcohol use: No  . Drug use: No  . Sexual activity: Yes    Birth control/protection: IUD  Lifestyle  . Physical activity:    Days per week: Not on file    Minutes per session: Not on file  . Stress: Not on file  Relationships  . Social connections:    Talks on phone: Not on file    Gets together: Not on file    Attends religious service: Not on file    Active member of club or organization: Not on file    Attends meetings of clubs or organizations: Not on file   Relationship status: Not on file  . Intimate partner violence:    Fear of current or ex partner: Not on file    Emotionally abused: Not on file    Physically abused: Not on file    Forced sexual activity: Not on file  Other Topics Concern  . Not on file  Social History Narrative  . Not on file    Past Surgical History:  Procedure Laterality Date  . LAPAROSCOPIC CHOLECYSTECTOMY  2006  . TUBAL LIGATION  2009   after delivery    Family History  Problem Relation Age of Onset  . Breast cancer Maternal Grandmother 58  . Ovarian cancer Maternal Aunt   . Uterine cancer Maternal Aunt     No Known Allergies  Current Outpatient Medications on File Prior to Visit  Medication Sig Dispense Refill  . levonorgestrel (MIRENA) 20 MCG/24HR IUD 1 each by Intrauterine route once.     No current facility-administered medications on file prior to visit.     BP 118/80   Pulse 90   Temp 98.1 F (36.7 C) (Oral)   Ht 5\' 5"  (1.651 m)   Wt 183 lb (83 kg)  SpO2 99%   BMI 30.45 kg/m    Objective:   Physical Exam  Constitutional: She appears well-nourished.  Neck: Neck supple.  Cardiovascular: Normal rate and regular rhythm.  Respiratory: Effort normal and breath sounds normal.  Skin: Skin is warm and dry.  Psychiatric: She has a normal mood and affect.           Assessment & Plan:

## 2018-10-22 NOTE — Assessment & Plan Note (Signed)
Present for the last 2 weeks, no particular trigger. GAD 7 score of 17 today, given duration of symptoms does not qualify for SSRI.  Will start with hydroxyzine to use HS, and PRN if needed during the day. Drowsiness precautions provided. She will update.  If symptoms persist then consider ruling out any metabolic cause for symptoms with lab work.

## 2018-10-27 ENCOUNTER — Ambulatory Visit (INDEPENDENT_AMBULATORY_CARE_PROVIDER_SITE_OTHER): Payer: Managed Care, Other (non HMO) | Admitting: Obstetrics and Gynecology

## 2018-10-27 ENCOUNTER — Encounter: Payer: Self-pay | Admitting: Obstetrics and Gynecology

## 2018-10-27 VITALS — BP 124/74 | Ht 65.0 in | Wt 184.0 lb

## 2018-10-27 DIAGNOSIS — F419 Anxiety disorder, unspecified: Secondary | ICD-10-CM

## 2018-10-27 DIAGNOSIS — Z30433 Encounter for removal and reinsertion of intrauterine contraceptive device: Secondary | ICD-10-CM

## 2018-10-27 DIAGNOSIS — L659 Nonscarring hair loss, unspecified: Secondary | ICD-10-CM

## 2018-10-27 MED ORDER — LEVONORGESTREL 20 MCG/24HR IU IUD: 1.0000 | INTRAUTERINE_SYSTEM | Freq: Once | INTRAUTERINE | 0 refills | Status: AC

## 2018-10-27 NOTE — Progress Notes (Signed)
    IUD Removal  Patient identified, informed consent performed, consent signed.  Patient was in the dorsal lithotomy position, normal external genitalia was noted.  A speculum was placed in the patient's vagina, normal discharge was noted, no lesions. The cervix was visualized, no lesions, no abnormal discharge.  The IUD strings were teased out of the cervix with a cytobrush, grasped with the [facility] forceps. The IUD was removed in its entirety.  Patient tolerated the procedure well.    IUD Insertion Procedure Note Patient identified, informed consent performed, consent signed.   Discussed risks of irregular bleeding, cramping, infection, malpositioning, expulsion or uterine perforation of the IUD (1:1000 placements)  which may require further procedure such as laparoscopy.  IUD while effective at preventing pregnancy do not prevent transmission of sexually transmitted diseases and use of barrier methods for this purpose was discussed. Time out was performed.  Urine pregnancy test negative.  Speculum already placed in the vagina.  Cervix previously visualized.  Cleaned with Betadine x 2.  Grasped anteriorly with a single tooth tenaculum.  Uterus sounded to 9 cm. IUD placed per manufacturer's recommendations.  Strings trimmed to 3 cm. Tenaculum was removed, good hemostasis noted.  Patient tolerated procedure well.   Patient was given post-procedure instructions.  She was advised to have backup contraception for one week.  Patient was also asked to check IUD strings periodically and follow up in 4 weeks for IUD check.  Prentice Docker, MD, [facility], [facility] 10/27/2018 5:58 PM

## 2018-10-27 NOTE — Telephone Encounter (Signed)
Mirena reserved for this patient.

## 2018-11-03 DIAGNOSIS — F419 Anxiety disorder, unspecified: Secondary | ICD-10-CM

## 2018-11-04 LAB — 17-HYDROXYPROGESTERONE: 17-Hydroxyprogesterone: 38 ng/dL

## 2018-11-04 LAB — TESTOSTERONE,FREE AND TOTAL
TESTOSTERONE: 31 ng/dL (ref 8–48)
Testosterone, Free: 6.5 pg/mL — ABNORMAL HIGH (ref 0.0–4.2)

## 2018-11-04 LAB — TSH+FREE T4
Free T4: 0.96 ng/dL (ref 0.82–1.77)
TSH: 1.34 u[IU]/mL (ref 0.450–4.500)

## 2018-11-04 LAB — DHEA-SULFATE: DHEA-SO4: 286.2 ug/dL — ABNORMAL HIGH (ref 57.3–279.2)

## 2018-11-04 LAB — HGB A1C W/O EAG: Hgb A1c MFr Bld: 5.2 % (ref 4.8–5.6)

## 2018-11-04 MED ORDER — HYDROXYZINE HCL 25 MG PO TABS: 25.0000 mg | ORAL_TABLET | Freq: Two times a day (BID) | ORAL | 1 refills | Status: AC | PRN

## 2018-11-18 ENCOUNTER — Other Ambulatory Visit: Payer: Self-pay | Admitting: Obstetrics and Gynecology

## 2018-11-18 DIAGNOSIS — L659 Nonscarring hair loss, unspecified: Secondary | ICD-10-CM

## 2018-11-18 DIAGNOSIS — E281 Androgen excess: Secondary | ICD-10-CM

## 2018-12-04 ENCOUNTER — Telehealth: Payer: Managed Care, Other (non HMO) | Admitting: Family

## 2018-12-04 DIAGNOSIS — J029 Acute pharyngitis, unspecified: Secondary | ICD-10-CM | POA: Diagnosis not present

## 2018-12-04 MED ORDER — AMOXICILLIN 875 MG PO TABS: 875.0000 mg | ORAL_TABLET | Freq: Two times a day (BID) | ORAL | 0 refills | Status: AC

## 2018-12-04 NOTE — Progress Notes (Signed)
We are sorry that you are not feeling well.  Here is how we plan to help!  Based on what you have shared with me it is likely that you have strep pharyngitis.  Strep pharyngitis is inflammation and infection in the back of the throat.  This is an infection cause by bacteria and is treated with antibiotics.  I have prescribed Amoxicillin 875 mg  one tablet twice daily for 7 days. For throat pain, we recommend over the counter oral pain relief medications such as acetaminophen or aspirin, or anti-inflammatory medications such as ibuprofen or naproxen sodium. Topical treatments such as oral throat lozenges or sprays may be used as needed. Strep infections are not as easily transmitted as other respiratory infections, however we still recommend that you avoid close contact with loved ones, especially the very young and elderly.  Remember to wash your hands thoroughly throughout the day as this is the number one way to prevent the spread of infection and wipe down door knobs and counters with disinfectant.   Home Care:  Only take medications as instructed by your medical team.  Complete the entire course of an antibiotic.  Do not take these medications with alcohol.  A steam or ultrasonic humidifier can help congestion.  You can place a towel over your head and breathe in the steam from hot water coming from a faucet.  Avoid close contacts especially the very young and the elderly.  Cover your mouth when you cough or sneeze.  Always remember to wash your hands.  Get Help Right Away If:  You develop worsening fever or sinus pain.  You develop a severe head ache or visual changes.  Your symptoms persist after you have completed your treatment plan.  Make sure you  Understand these instructions.  Will watch your condition.  Will get help right away if you are not doing well or get worse.  Your e-visit answers were reviewed by a board certified advanced clinical practitioner to complete  your personal care plan.  Depending on the condition, your plan could have included both over the counter or prescription medications.  If there is a problem please reply  once you have received a response from your provider.  Your safety is important to [place].  If you have drug allergies check your prescription carefully.    You can use MyChart to ask questions about today's visit, request a non-urgent call back, or ask for a work or school excuse for 24 hours related to this e-Visit. If it has been greater than 24 hours you will need to follow up with your provider, or enter a new e-Visit to address those concerns.  You will get an e-mail in the next two days asking about your experience.  I hope that your e-visit has been valuable and will speed your recovery. Thank you for using e-visits.

## 2018-12-09 ENCOUNTER — Ambulatory Visit (INDEPENDENT_AMBULATORY_CARE_PROVIDER_SITE_OTHER): Payer: Managed Care, Other (non HMO) | Admitting: Obstetrics and Gynecology

## 2018-12-09 ENCOUNTER — Encounter: Payer: Self-pay | Admitting: Obstetrics and Gynecology

## 2018-12-09 VITALS — BP 124/74 | Wt 183.0 lb

## 2018-12-09 DIAGNOSIS — Z30431 Encounter for routine checking of intrauterine contraceptive device: Secondary | ICD-10-CM

## 2018-12-09 NOTE — Progress Notes (Signed)
   IUD String Check  Subjctive: Ms. Leslie Jordan presents for IUD string check.  She had a Mirena placed 4 weeks ago.  Since placement of her IUD she had minimal vaginal bleeding.  She denies cramping or discomfort.  She has had intercourse since placement.  She has not checked the strings.  She denies any fever, chills, nausea, vomiting, or other complaints.    Objective: BP 124/74   Wt 183 lb (83 kg)   BMI 30.45 kg/m  Physical Exam Constitutional:      General: She is not in acute distress.    Appearance: Normal appearance.  HENT:     Head: Normocephalic and atraumatic.  Eyes:     General: No scleral icterus.    Conjunctiva/sclera: Conjunctivae normal.  Abdominal:     General: There is no distension.     Palpations: Abdomen is soft.     Tenderness: There is no abdominal tenderness. There is no guarding or rebound.  Genitourinary:    General: Normal vulva.     Exam position: Supine.     Labia:        Right: No rash, tenderness or lesion.        Left: No rash, tenderness or lesion.      Vagina: Normal.     Cervix: Normal.     Uterus: Normal.      Adnexa: Right adnexa normal and left adnexa normal.     Comments: IUD strings noted and normal Musculoskeletal: Normal range of motion.        General: No swelling.  Skin:    General: Skin is warm and dry.  Neurological:     General: No focal deficit present.     Mental Status: She is alert and oriented to person, place, and time.     Cranial Nerves: No cranial nerve deficit.  Psychiatric:        Mood and Affect: Mood normal.        Behavior: Behavior normal.        Judgment: Judgment normal.    Female chaperone was present for the entirety of the pelvic exam  Assessment: 38 y.o. year old female status post prior Mirena IUD placement 4 week ago, doing well.  Plan: 1.  The patient was given instructions to check her IUD strings monthly and call with any problems or concerns.  She should call for fevers, chills,  abnormal vaginal discharge, pelvic pain, or other complaints. 2.  She will return for a annual exam in 1 year.  All questions answered.  15 minutes spent in face to face discussion with > 50% spent in counseling, management, and coordination of care for her newly-placed IUD.  Risks and benefits of IUD discussed including the risks of irregular bleeding, cramping, infection, malpositioning, expulsion, which may require further procedures such as laparoscopy.  IUDs while effective at preventing pregnancy do not prevent transmission of sexually transmitted diseases and use of barrier methods for this purpose was discussed.  Low overall incidence of failure with 99.7% efficacy rate in typical use.    Prentice Docker, MD 12/09/2018 8:23 AM

## 2019-05-09 ENCOUNTER — Telehealth: Payer: Managed Care, Other (non HMO) | Admitting: Physician Assistant

## 2019-05-09 ENCOUNTER — Encounter: Payer: Self-pay | Admitting: Physician Assistant

## 2019-05-09 DIAGNOSIS — H00014 Hordeolum externum left upper eyelid: Secondary | ICD-10-CM

## 2019-05-09 MED ORDER — NEOMYCIN-POLYMYXIN-HC OP SUSP: 2.0000 [drp] | OPHTHALMIC | 0 refills | Status: AC

## 2019-05-09 NOTE — Progress Notes (Signed)
We are sorry that you are not feeling well. Here is how we plan to help!  Based on what you have shared with me it looks like you have a stye.  A stye is an inflammation of the eyelid.  It is often a red, painful lump near the edge of the eyelid that may look like a boil or a pimple.  A stye develops when an infection occurs at the base of an eyelash.   We have made appropriate suggestions for you based upon your presentation: Your symptoms may indicate an infection of the sclera.  The use of anti-inflammatory and antibiotic eye drops for a week will help resolve this condition.  I have sent in neomycin-polymyxin HC opthalmic suspension, two to three drops in the affected eye every 4 hours.  If your symptoms do not improve over the next two to three days you should be seen in your doctor's office.  HOME CARE:   Wash your hands often!  Let the stye open on its own. Don't squeeze or open it.  Don't rub your eyes. This can irritate your eyes and let in bacteria.  If you need to touch your eyes, wash your hands first.  Don't wear eye makeup or contact lenses until the area has healed.  GET HELP RIGHT AWAY IF:   Your symptoms do not improve.  You develop blurred or loss of vision.  Your symptoms worsen (increased discharge, pain or redness).  Thank you for choosing an e-visit.  Your e-visit answers were reviewed by a board certified advanced clinical practitioner to complete your personal care plan.  Depending upon the condition, your plan could have included both over the counter or prescription medications.  Please review your pharmacy choice.  Make sure the pharmacy is open so you can pick up prescription now.  If there is a problem, you may contact your provider through [employer] and have the prescription routed to another pharmacy.    Your safety is important to [place].  If you have drug allergies check your prescription carefully.  For the next 24 hours you can use MyChart to  ask questions about today's visit, request a non-urgent call back, or ask for a work or school excuse.  You will get an email in the next two days asking about your experience.  I hope you that your e-visit has been valuable and will speed your recovery.   I spent 5-10 minutes on review and completion of this note- L[facility]

## 2019-12-13 ENCOUNTER — Ambulatory Visit: Payer: Managed Care, Other (non HMO) | Admitting: Obstetrics and Gynecology

## 2019-12-15 ENCOUNTER — Other Ambulatory Visit (HOSPITAL_COMMUNITY)
Admission: RE | Admit: 2019-12-15 | Discharge: 2019-12-15 | Disposition: A | Discharge status: Home / Self Care | Payer: Managed Care, Other (non HMO) | Source: Ambulatory Visit | Attending: Obstetrics and Gynecology | Admitting: Obstetrics and Gynecology

## 2019-12-15 ENCOUNTER — Ambulatory Visit (INDEPENDENT_AMBULATORY_CARE_PROVIDER_SITE_OTHER): Payer: Managed Care, Other (non HMO) | Admitting: Obstetrics and Gynecology

## 2019-12-15 ENCOUNTER — Encounter: Payer: Self-pay | Admitting: Obstetrics and Gynecology

## 2019-12-15 ENCOUNTER — Other Ambulatory Visit: Payer: Self-pay

## 2019-12-15 VITALS — BP 124/78 | Ht 65.0 in | Wt 162.0 lb

## 2019-12-15 DIAGNOSIS — Z1331 Encounter for screening for depression: Secondary | ICD-10-CM

## 2019-12-15 DIAGNOSIS — Z1339 Encounter for screening examination for other mental health and behavioral disorders: Secondary | ICD-10-CM

## 2019-12-15 DIAGNOSIS — Z124 Encounter for screening for malignant neoplasm of cervix: Secondary | ICD-10-CM | POA: Insufficient documentation

## 2019-12-15 DIAGNOSIS — Z01419 Encounter for gynecological examination (general) (routine) without abnormal findings: Secondary | ICD-10-CM

## 2019-12-15 DIAGNOSIS — Z8041 Family history of malignant neoplasm of ovary: Secondary | ICD-10-CM | POA: Insufficient documentation

## 2019-12-15 DIAGNOSIS — Z803 Family history of malignant neoplasm of breast: Secondary | ICD-10-CM | POA: Insufficient documentation

## 2019-12-15 DIAGNOSIS — Z8049 Family history of malignant neoplasm of other genital organs: Secondary | ICD-10-CM

## 2019-12-15 NOTE — Patient Instructions (Signed)
Website for [employer] info for [employer] testing:  [email or web address]

## 2019-12-15 NOTE — Progress Notes (Signed)
Gynecology Annual Exam  PCP: Pleas Koch, NP  Chief Complaint  Patient presents with  . Annual Exam    History of Present Illness:  Ms. Leslie Jordan is a 39 y.o. [phone] who LMP was No LMP recorded. (Menstrual status: IUD)., presents today for her annual examination.  Her menses are nearly absent with her IUD.   She is sexually active. She has no issues with intercourse. She uses a tubal ligation and IUD for contraception. .  Last Pap: 11/2015 (no records)  Results were: no abnormalities /neg HPV DNA negative Hx of STDs: none  There is no a family history of breast cancer in her grandmother. There is a FH of ovarian cancer in her maternal aunt.  There is also a family history of uterine cancer in her mother in her 77s. The patient does not do self-breast exams.  Tobacco use: former smoker, quit in 2016 Alcohol use: social drinker Exercise: 3 x per week  The patient wears seatbelts: yes.   The patient reports that domestic violence in her life is absent.   Past Medical History:  Diagnosis Date  . Acne vulgaris   . Iron deficiency anemia   . PCOS (polycystic ovarian syndrome)     Past Surgical History:  Procedure Laterality Date  . LAPAROSCOPIC CHOLECYSTECTOMY  2006  . TUBAL LIGATION  2009   after delivery    Prior to Admission medications   Medication Sig Start Date End Date Taking? Authorizing Provider  metFORMIN (GLUCOPHAGE-XR) 500 MG 24 hr tablet TAKE 2 TABLETS(1000 MG) BY MOUTH EVERY DAY 10/07/19  Yes [provider]  Multiple Vitamins-Minerals (EMERGEN-C IMMUNE PLUS/VIT D PO)    Yes [provider]  spironolactone (ALDACTONE) 25 MG tablet Take by mouth. 07/14/19 07/13/20 Yes [provider]  levonorgestrel (MIRENA) 20 MCG/24HR IUD 1 Intra Uterine Device (1 each total) by Intrauterine route once for 1 dose. 10/27/18 10/27/18  Will Bonnet, MD    No Known Allergies   Obstetric [id number]  Social History    Socioeconomic History  . Marital status: Married    Spouse name: Not on file  . Number of children: Not on file  . Years of education: Not on file  . Highest education level: Not on file  Occupational History  . Not on file  Tobacco Use  . Smoking status: Former Research scientist (life sciences)  . Smokeless tobacco: Never Used  Substance and Sexual Activity  . Alcohol use: No  . Drug use: No  . Sexual activity: Yes    Birth control/protection: I.U.D.  Other Topics Concern  . Not on file  Social History Narrative  . Not on file   Social Determinants of Health   Financial Resource Strain:   . Difficulty of Paying Living Expenses: Not on file  Food Insecurity:   . Worried About Charity fundraiser in the Last Year: Not on file  . Ran Out of Food in the Last Year: Not on file  Transportation Needs:   . Lack of Transportation (Medical): Not on file  . Lack of Transportation (Non-Medical): Not on file  Physical Activity:   . Days of Exercise per Week: Not on file  . Minutes of Exercise per Session: Not on file  Stress:   . Feeling of Stress : Not on file  Social Connections:   . Frequency of Communication with Friends and Family: Not on file  . Frequency of Social Gatherings with Friends and Family: Not on file  .  Attends Religious Services: Not on file  . Active Member of Clubs or Organizations: Not on file  . Attends Archivist Meetings: Not on file  . Marital Status: Not on file  Intimate Partner Violence:   . Fear of Current or Ex-Partner: Not on file  . Emotionally Abused: Not on file  . Physically Abused: Not on file  . Sexually Abused: Not on file    Family History  Problem Relation Age of Onset  . Breast cancer Maternal Grandmother 5  . Ovarian cancer Maternal Aunt   . Uterine cancer Maternal Aunt     Review of Systems  Constitutional: Negative.   HENT: Negative.   Eyes: Negative.   Respiratory: Negative.   Cardiovascular: Negative.   Gastrointestinal: Negative.    Genitourinary: Negative.   Musculoskeletal: Negative.   Skin: Negative.   Neurological: Negative.   Psychiatric/Behavioral: Negative.      Physical Exam BP 124/78   Ht 5\' 5"  (1.651 m)   Wt 162 lb (73.5 kg)   BMI 26.96 kg/m    Physical Exam Constitutional:      General: She is not in acute distress.    Appearance: Normal appearance. She is well-developed.  Genitourinary:     Pelvic exam was performed with patient supine.     Vulva, urethra, bladder and uterus normal.     No inguinal adenopathy present in the right or left side.    No signs of injury in the vagina.     No vaginal discharge, erythema, tenderness or bleeding.     No cervical motion tenderness, discharge, lesion or polyp.     Uterus is mobile.     Uterus is not enlarged or tender.     No uterine mass detected.    Uterus is anteverted.     No right or left adnexal mass present.     Right adnexa not tender or full.     Left adnexa not tender or full.  HENT:     Head: Normocephalic and atraumatic.  Eyes:     General: No scleral icterus.    Conjunctiva/sclera: Conjunctivae normal.  Neck:     Thyroid: No thyromegaly.  Cardiovascular:     Rate and Rhythm: Normal rate and regular rhythm.     Heart sounds: No murmur. No friction rub. No gallop.   Pulmonary:     Effort: Pulmonary effort is normal. No respiratory distress.     Breath sounds: Normal breath sounds. No wheezing or rales.  Chest:     Breasts:        Right: No inverted nipple, mass, nipple discharge, skin change or tenderness.        Left: No inverted nipple, mass, nipple discharge, skin change or tenderness.  Abdominal:     General: Bowel sounds are normal. There is no distension.     Palpations: Abdomen is soft. There is no mass.     Tenderness: There is no abdominal tenderness. There is no guarding or rebound.  Musculoskeletal:        General: No swelling or tenderness. Normal range of motion.     Cervical back: Normal range of motion and neck  supple.  Lymphadenopathy:     Cervical: No cervical adenopathy.     Lower Body: No right inguinal adenopathy. No left inguinal adenopathy.  Neurological:     General: No focal deficit present.     Mental Status: She is alert and oriented to person, place, and time.  Cranial Nerves: No cranial nerve deficit.  Skin:    General: Skin is warm and dry.     Findings: No erythema or rash.  Psychiatric:        Mood and Affect: Mood normal.        Behavior: Behavior normal.        Judgment: Judgment normal.     Female chaperone present for pelvic and breast  portions of the physical exam  Results: AUDIT Questionnaire (screen for alcoholism): 1 PHQ-9: 1   Assessment: 39 y.o. [id number] female here for routine annual gynecologic examination  Plan: Problem List Items Addressed This Visit      Other   Family history of ovarian cancer   Family history of breast cancer   Family history of uterine cancer    Other Visit Diagnoses    Women's annual routine gynecological examination    -  Primary   Relevant Orders   Cytology - PAP   Screening for depression       Screening for alcoholism       Pap smear for cervical cancer screening       Relevant Orders   Cytology - PAP      Screening: -- Blood pressure screen normal  -- Mammogram: she understands that it is her responsibility to call [place] to arrange.  -- Weight screening: normal -- Depression screening negative (PHQ-9) -- Nutrition: normal -- cholesterol screening: per PCP -- osteoporosis screening: not due -- tobacco screening: not using -- alcohol screening: AUDIT questionnaire indicates low-risk usage. -- family history of breast cancer screening: done. not at high risk. -- no evidence of domestic violence or intimate partner violence. -- STD screening: gonorrhea/chlamydia NAAT not collected per patient request. -- pap smear collected per ASCCP guidelines -- flu vaccine received this season  Family history of  breast, ovarian, and uterine cancer: offered genetic mutation testing. She will talk with her insurance company regarding cost. She is interested in pursuing.    Prentice Docker, MD 12/15/2019 4:27 PM

## 2019-12-16 ENCOUNTER — Other Ambulatory Visit: Payer: Self-pay | Admitting: Obstetrics and Gynecology

## 2019-12-16 DIAGNOSIS — Z1231 Encounter for screening mammogram for malignant neoplasm of breast: Secondary | ICD-10-CM

## 2019-12-19 LAB — CYTOLOGY - PAP
Comment: NEGATIVE
Diagnosis: NEGATIVE
High risk HPV: NEGATIVE

## 2019-12-20 ENCOUNTER — Ambulatory Visit
Admission: RE | Admit: 2019-12-20 | Discharge: 2019-12-20 | Disposition: A | Discharge status: Home / Self Care | Payer: Managed Care, Other (non HMO) | Source: Ambulatory Visit | Attending: Obstetrics and Gynecology | Admitting: Obstetrics and Gynecology

## 2019-12-20 DIAGNOSIS — Z1231 Encounter for screening mammogram for malignant neoplasm of breast: Secondary | ICD-10-CM | POA: Insufficient documentation

## 2019-12-31 ENCOUNTER — Ambulatory Visit: Payer: Managed Care, Other (non HMO)

## 2020-01-04 NOTE — Telephone Encounter (Signed)
Mirena rcvd/charged 10/27/18

## 2020-12-17 ENCOUNTER — Ambulatory Visit: Payer: Managed Care, Other (non HMO) | Admitting: Obstetrics and Gynecology

## 2021-06-25 ENCOUNTER — Other Ambulatory Visit: Payer: Self-pay | Admitting: Internal Medicine

## 2021-06-25 DIAGNOSIS — Z1231 Encounter for screening mammogram for malignant neoplasm of breast: Secondary | ICD-10-CM

## 2021-08-15 ENCOUNTER — Other Ambulatory Visit: Payer: Self-pay

## 2021-08-15 ENCOUNTER — Ambulatory Visit
Admission: RE | Admit: 2021-08-15 | Discharge: 2021-08-15 | Disposition: A | Discharge status: Home / Self Care | Payer: Managed Care, Other (non HMO) | Source: Ambulatory Visit | Attending: Internal Medicine | Admitting: Internal Medicine

## 2021-08-15 DIAGNOSIS — Z1231 Encounter for screening mammogram for malignant neoplasm of breast: Secondary | ICD-10-CM | POA: Insufficient documentation

## 2021-08-22 ENCOUNTER — Other Ambulatory Visit: Payer: Self-pay | Admitting: Internal Medicine

## 2021-08-22 DIAGNOSIS — N6489 Other specified disorders of breast: Secondary | ICD-10-CM

## 2021-08-22 DIAGNOSIS — R928 Other abnormal and inconclusive findings on diagnostic imaging of breast: Secondary | ICD-10-CM

## 2021-08-29 ENCOUNTER — Ambulatory Visit
Admission: RE | Admit: 2021-08-29 | Discharge: 2021-08-29 | Disposition: A | Discharge status: Home / Self Care | Payer: Managed Care, Other (non HMO) | Source: Ambulatory Visit | Attending: Internal Medicine | Admitting: Internal Medicine

## 2021-08-29 ENCOUNTER — Other Ambulatory Visit: Payer: Self-pay | Admitting: Internal Medicine

## 2021-08-29 ENCOUNTER — Other Ambulatory Visit: Payer: Self-pay

## 2021-08-29 DIAGNOSIS — N6489 Other specified disorders of breast: Secondary | ICD-10-CM

## 2021-08-29 DIAGNOSIS — R928 Other abnormal and inconclusive findings on diagnostic imaging of breast: Secondary | ICD-10-CM

## 2021-09-03 ENCOUNTER — Other Ambulatory Visit: Payer: Self-pay | Admitting: Internal Medicine

## 2021-09-03 DIAGNOSIS — N6320 Unspecified lump in the left breast, unspecified quadrant: Secondary | ICD-10-CM

## 2021-09-03 DIAGNOSIS — R928 Other abnormal and inconclusive findings on diagnostic imaging of breast: Secondary | ICD-10-CM

## 2021-09-03 DIAGNOSIS — N6310 Unspecified lump in the right breast, unspecified quadrant: Secondary | ICD-10-CM

## 2022-03-04 ENCOUNTER — Ambulatory Visit
Admission: RE | Admit: 2022-03-04 | Discharge: 2022-03-04 | Disposition: A | Discharge status: Home / Self Care | Payer: Managed Care, Other (non HMO) | Source: Ambulatory Visit | Attending: Internal Medicine | Admitting: Internal Medicine

## 2022-03-04 ENCOUNTER — Other Ambulatory Visit: Payer: Managed Care, Other (non HMO)

## 2022-03-04 DIAGNOSIS — N6310 Unspecified lump in the right breast, unspecified quadrant: Secondary | ICD-10-CM | POA: Diagnosis present

## 2022-03-04 DIAGNOSIS — R928 Other abnormal and inconclusive findings on diagnostic imaging of breast: Secondary | ICD-10-CM | POA: Insufficient documentation

## 2022-03-04 DIAGNOSIS — N6320 Unspecified lump in the left breast, unspecified quadrant: Secondary | ICD-10-CM

## 2022-09-27 IMAGING — MG DIGITAL DIAGNOSTIC BILAT W/ TOMO W/ CAD
8 series · 8 of 24 positions shown · non-contrast
Comparison: Previous exam(s).

CLINICAL DATA: BI-RADS 3 follow-up of bilateral breast masses.
These masses were sonographically identified August 29, 2021



[L MLO synth-2D]
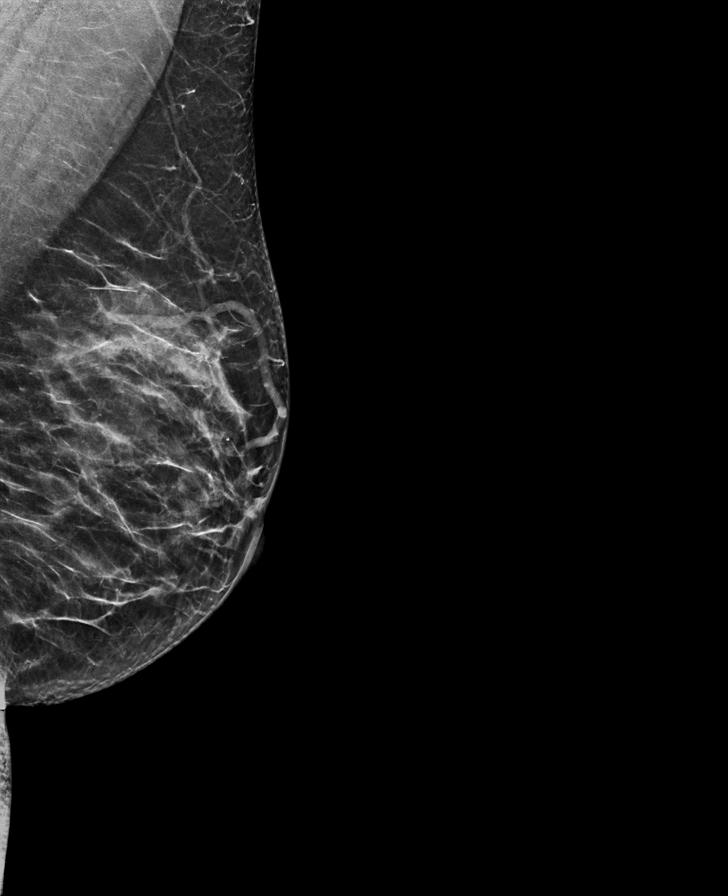

[L CC synth-2D]
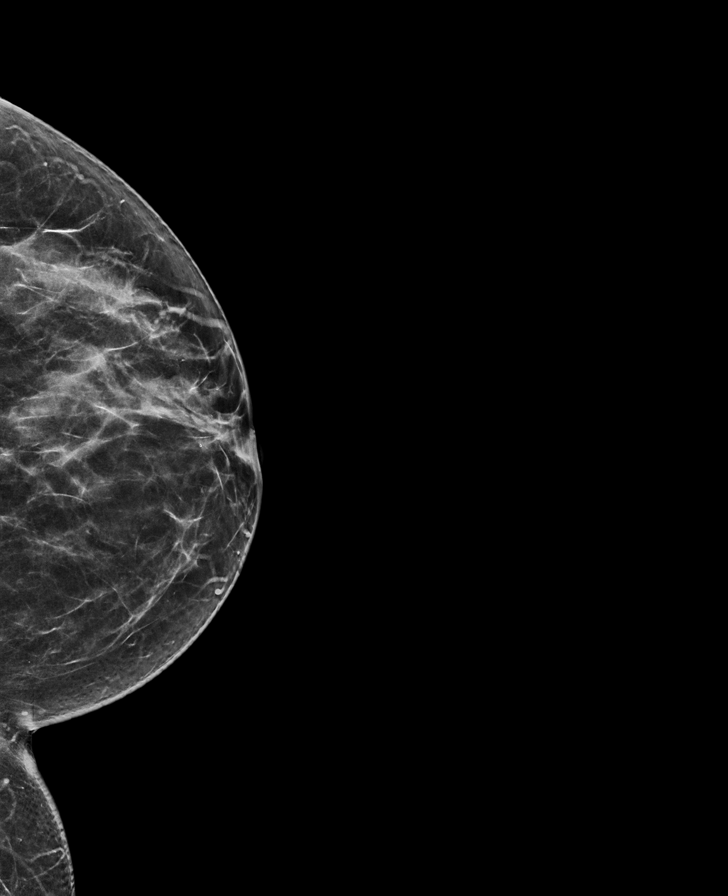

[R CC synth-2D]
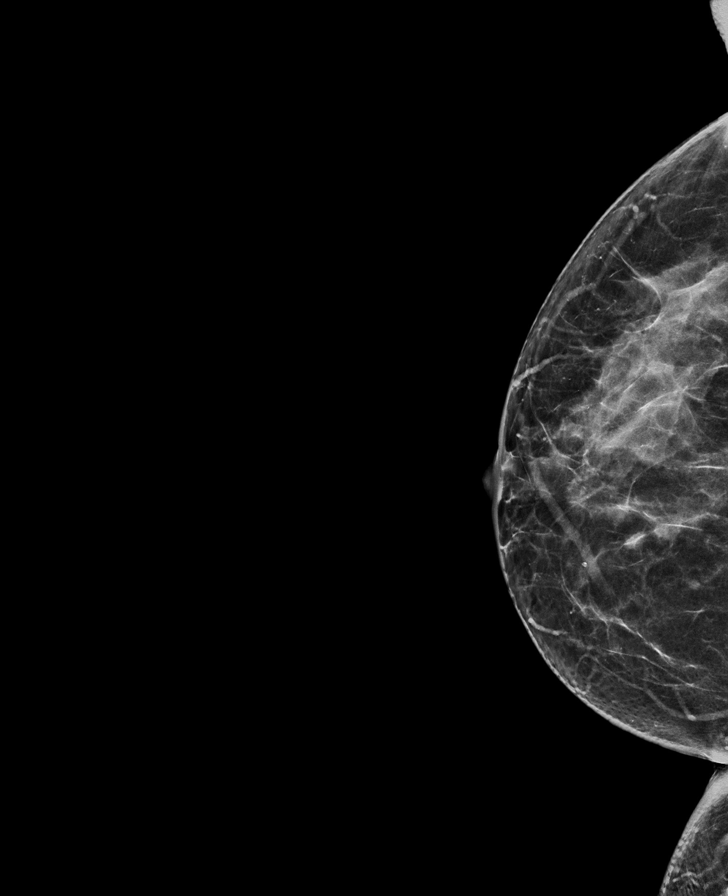

[R MLO synth-2D]
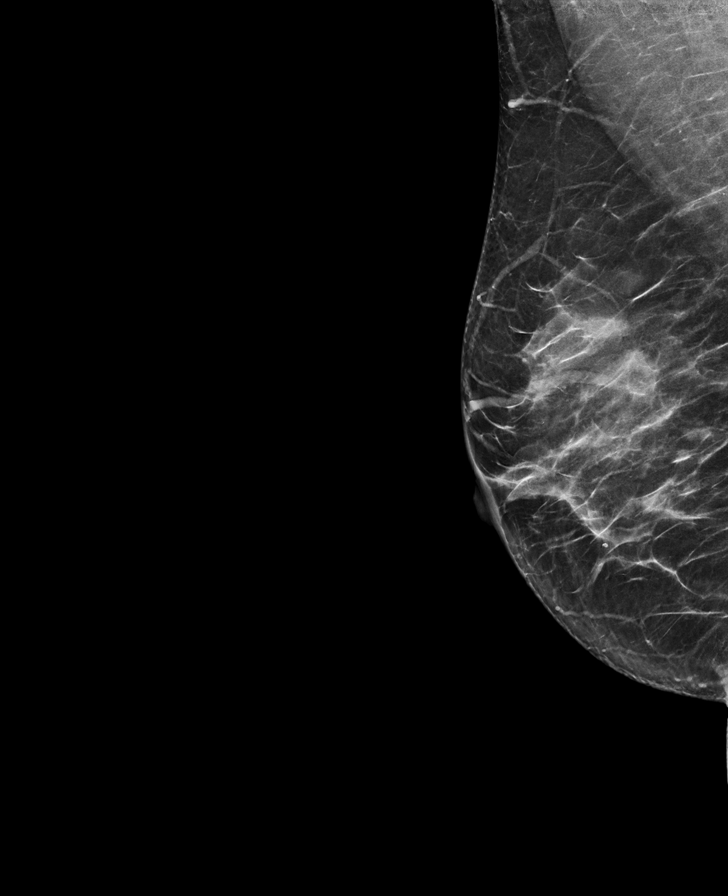

[R CC tomo · tomo slice 35/69.0]
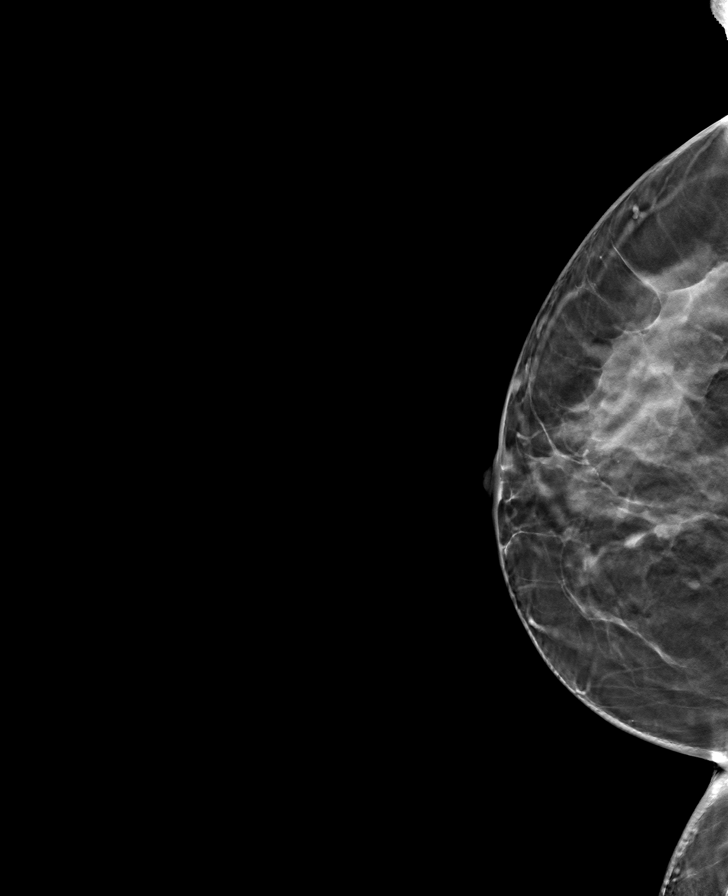

[R MLO tomo · tomo slice 36/71.0]
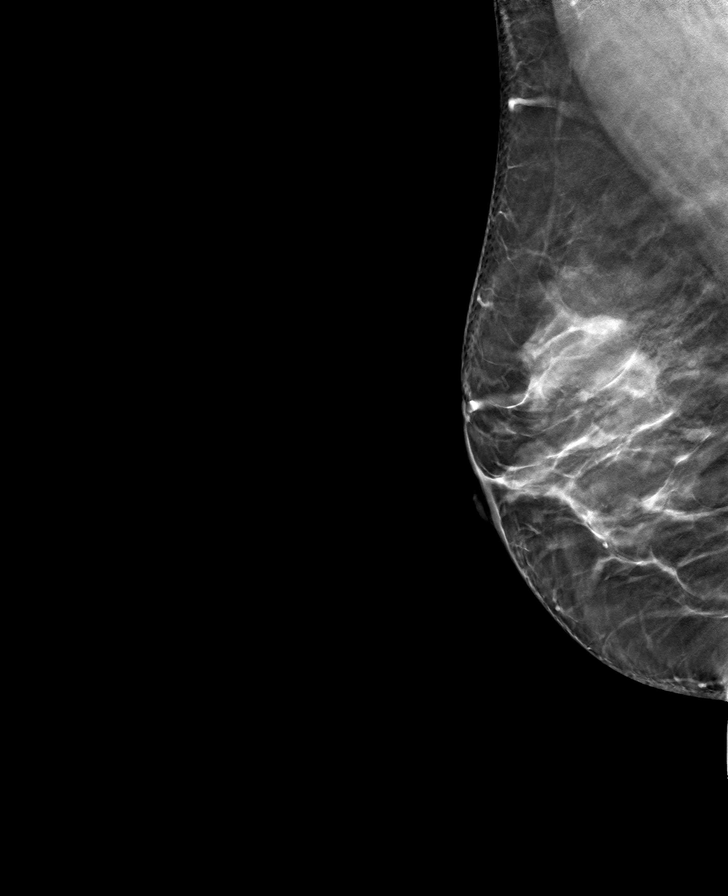

[L MLO tomo · tomo slice 35/70.0]
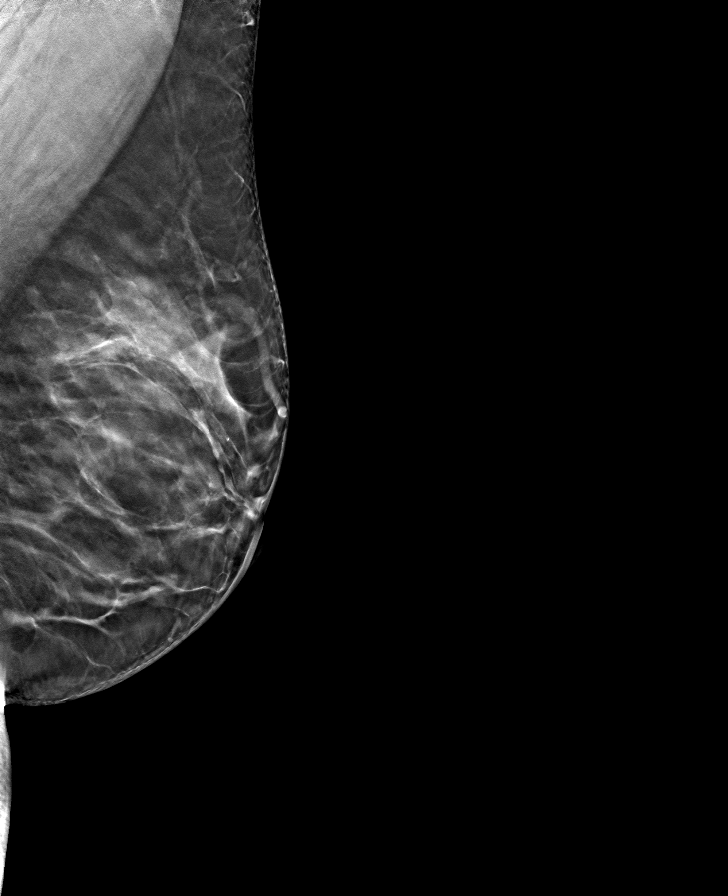

[L CC tomo · tomo slice 34/67.0]
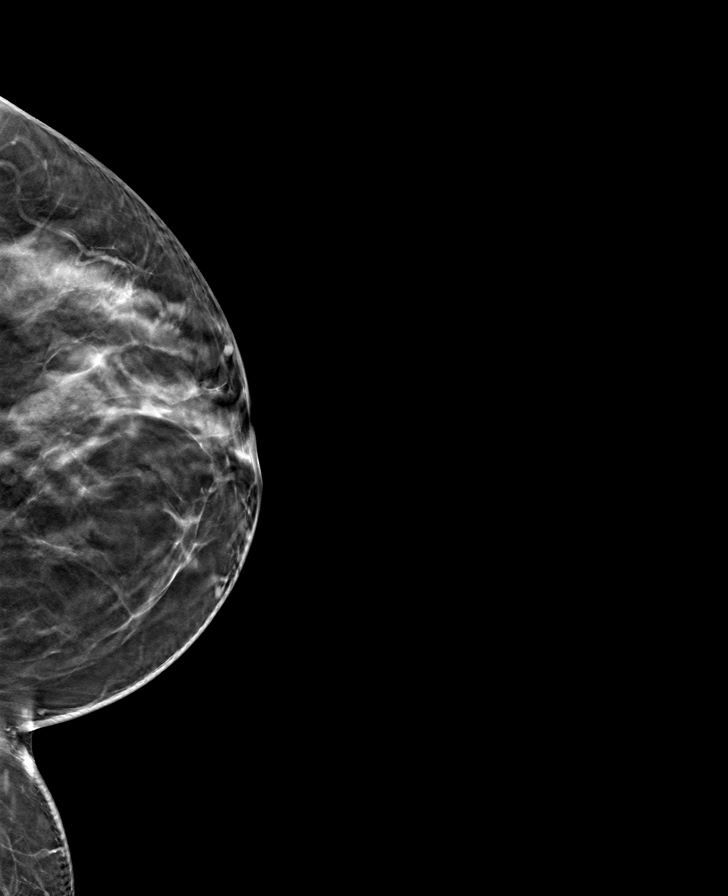

[8 of 24 positions shown; findings below may reference images not displayed]

ACR Breast Density Category c: The breast tissue is heterogeneously
dense, which may obscure small masses.
FINDINGS: Diagnostic images of bilateral breasts demonstrate no new suspicious
findings. The previously described bilateral masses are
sonographically identified.

Targeted ultrasound was performed of the RIGHT breast. At 3 o'clock
3 cm from the nipple, there is an oval circumscribed anechoic mass
with a thin internal septation and posterior acoustic enhancement.
It measures 8 x 5 x 8 mm, previously 11 by 8 x 6 mm. This is
consistent with a benign minimally complicated cyst.

Targeted ultrasound was performed of the LEFT breast.

At 3 o'clock 4 cm from the nipple, there is an oval circumscribed
anechoic mass posterior acoustic enhancement. It measures 6 by 7 x 4
mm, previously 6 x 3 x 6 mm. This is consistent with a benign cyst.
Innumerable additional benign cysts were noted during real-time
examination.

An additional oval circumscribed hypoechoic mass at 3 o'clock 4 cm
from the nipple measures 4 x 3 x 4 mm, previously 4 x 2 by 4 mm.
This is stable in comparison to prior and remains probably benign.

At [DATE] 3 cm from the nipple, there is an oval circumscribed
anechoic mass with several thin internal septations and posterior
acoustic enhancement. It measures 13 x 5 x 7 mm, previously 12 x 4 x
7 mm. This is consistent with a benign cluster of cysts. Innumerable
additional benign cysts were noted during real-time examination.
IMPRESSION: 1. On today's examination, mass in the RIGHT breast at 3 o'clock 3
cm from the nipple is consistent with a benign cyst. No mammographic
or sonographic evidence of malignancy in the RIGHT breast.
2. Stable probably benign 4 mm mass in the LEFT breast at 3 o'clock
4 cm from the nipple which likely reflects a mildly complicated cyst
given presence of innumerable additional benign cysts. Recommend
follow-up mammogram and ultrasound in 6 months. This will establish
1 year of definitive stability.
3. Additional previously described masses in the LEFT breast are
consistent with benign cysts.

RECOMMENDATION:
Bilateral diagnostic mammogram with LEFT breast ultrasound in 6
months. This will maintain patient on appropriate BI-RADS 3
schedule.

I have discussed the findings and recommendations with the patient.
If applicable, a reminder letter will be sent to the patient
regarding the next appointment.

BI-RADS CATEGORY  3: Probably benign.

## 2023-04-27 ENCOUNTER — Other Ambulatory Visit: Payer: Self-pay | Admitting: Internal Medicine

## 2023-04-27 DIAGNOSIS — N63 Unspecified lump in unspecified breast: Secondary | ICD-10-CM

## 2023-04-29 ENCOUNTER — Other Ambulatory Visit: Payer: Self-pay | Admitting: Internal Medicine

## 2023-04-29 DIAGNOSIS — R7989 Other specified abnormal findings of blood chemistry: Secondary | ICD-10-CM

## 2023-06-30 ENCOUNTER — Ambulatory Visit: Payer: Managed Care, Other (non HMO) | Admitting: Physical Therapy

## 2023-07-07 ENCOUNTER — Ambulatory Visit: Payer: Managed Care, Other (non HMO) | Attending: Internal Medicine | Admitting: Physical Therapy

## 2023-07-07 ENCOUNTER — Encounter: Payer: Self-pay | Admitting: Physical Therapy

## 2023-07-07 DIAGNOSIS — M5459 Other low back pain: Secondary | ICD-10-CM

## 2023-07-07 DIAGNOSIS — R278 Other lack of coordination: Secondary | ICD-10-CM

## 2023-07-07 DIAGNOSIS — R2689 Other abnormalities of gait and mobility: Secondary | ICD-10-CM | POA: Diagnosis present

## 2023-07-07 DIAGNOSIS — M533 Sacrococcygeal disorders, not elsewhere classified: Secondary | ICD-10-CM

## 2023-07-07 NOTE — Therapy (Signed)
OUTPATIENT PHYSICAL THERAPY EVALUATION   Patient Name: Leslie Jordan MRN: [id number] DOB:29-Jun-1981, 42 y.o., female Today's Date: 07/07/2023   PT End of Session - 07/07/23 0902     Visit Number 1    Number of Visits 10    Date for PT Re-Evaluation 09/15/23    PT Start Time 0850    PT Stop Time 0930    PT Time Calculation (min) 40 min    Activity Tolerance Patient tolerated treatment well;No increased pain    Behavior During Therapy WFL for tasks assessed/performed             Past Medical History:  Diagnosis Date   Acne vulgaris    Iron deficiency anemia    PCOS (polycystic ovarian syndrome)    Past Surgical History:  Procedure Laterality Date   LAPAROSCOPIC CHOLECYSTECTOMY  2006   TUBAL LIGATION  2009   after delivery   Patient Active Problem List   Diagnosis Date Noted   Family history of ovarian cancer 12/15/2019   Family history of breast cancer 12/15/2019   Family history of uterine cancer 12/15/2019   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   Acute anxiety 10/22/2018   Fatigue 02/02/2017   Alopecia 02/02/2017   Anemia, iron deficiency 12/13/2014   Routine general medical examination at a health care facility 12/02/2013    PCP: Nemiah Commander  REFERRING PROVIDER: Nemiah Commander   REFERRING DIAG: Pelvic floor dysfunction   Rationale for Evaluation and Treatment Rehabilitation  THERAPY DIAG:  Sacrococcygeal disorders, not elsewhere classified  Other abnormalities of gait and mobility  Other lack of coordination  Other Low back pain   ONSET DATE:   SUBJECTIVE:                                                                                                                                                                                           SUBJECTIVE STATEMENT:  1) loose stools Stool consistency Type 6 since her gallbladder removal in 2006. Pt has regular bowel movements 1 x day. Pt was eating well but stopped. Pt is trying to get back on track with less  sugar and carbs.  Fluid intake: 80 floz of water, 12 fl oz coffee, 24 fl oz Pepsi,    2) CLBP: on busy days at work 9 hours , pain level 6-7/10 non-radiating at 4pm , pt is on her feet 90% of the time   3)  tight upper trap R>L:  always tight    Pt goes to gym 3 x week, lower body and HIIT and works with trainer with upper and lower body 2 x week.  Pt had done sit up  and crunches int he past but do not currently. Lifts weights on machines.  Pt is not breathing out when lifting weight.  PERTINENT HISTORY:  Tubal ligation, gall bladder removal,  denied tailbone/ ankle, knee , hip pain. 3 vaginal deliveries with perineal tear with one and two episiotomy with all children   PAIN:  Are you having pain? Yes: See above   PRECAUTIONS: None  WEIGHT BEARING RESTRICTIONS: No  FALLS:  Has patient fallen in last 6 months? No  LIVING ENVIRONMENT: Lives with: with son, his fiance, daughter  ( husband passed away)  Lives in: House/apartment Stairs: 2 story, STE with no rail  OCCUPATION: Engineer, site   PLOF: Independent  PATIENT GOALS:  To tighten and straighten everything up, not having low back ache at the end of the day , relax upper trap,  "become the best version I can be physically"     OBJECTIVE:     [facility] PT Assessment - 07/07/23 0932       Observation/Other Assessments   Observations ankles crossed , slumped      Coordination   Coordination and Movement Description chest breathing      Other:   Other/ Comments simulated deadlift: dyscoordination with breath      Strength   Overall Strength Comments Rhip flexion 3/5 ,  L 4/5      Palpation   Spinal mobility R sideflexion caused CLBP,    SI assessment  R shoulder/ iliac crest lowered      Ambulation/Gait   Gait Comments 1.3 m/s ( limited L posterior rotation of thorax and pelvis )             [facility] Adult PT Treatment/Exercise - 07/07/23 0932       Therapeutic Activites    Therapeutic Activities Other  Therapeutic Activities    Other Therapeutic Activities explained anatomy and physiology of deep core, related to Hx and Sx , cocreated goals      Neuro Re-ed    Neuro Re-ed Details  cued for sitting and sit to stand technique for less LBP              HOME EXERCISE PROGRAM: See pt instruction section    ASSESSMENT:  CLINICAL IMPRESSION:   Pt is a  42  yo  who presents with  loose stools, CLBP,  tight upper trap R>L  which impact QOL, ADL, fitness, and community activities.   Pt's musculoskeletal assessment revealed uneven pelvic girdle and shoulder height, asymmetries to gait pattern, limited spinal /pelvic mobility, dyscoordination and strength of pelvic floor mm, hip weakness, poor body mechanics which places strain on the abdominal/pelvic floor mm. These are deficits that indicate an ineffective intraabdominal pressure system associated with increased risk for pt's Sx.    Pt goes to gym 3 x week, lower body and HIIT and works with trainer with upper and lower body 2 x week.  Pt had done sit up and crunches int he past but do not currently. Lifts weights on machines. Advised pt to not perform sit-ups and crunches as these movement patterns lead to more downward forces on the pelvic floor, negatively impacting abdominopelvic/spinal dysfunctions.   Pt will benefit from coordination training and education on fitness and functional positions in order to gain a more effective intraabdominal pressure system to minimize Sx.   Pt was provided education on etiology of Sx with anatomy, physiology explanation with images along with the benefits of customized pelvic PT Tx based on pt's medical  conditions and musculoskeletal deficits.  Explained the physiology of deep core mm coordination and roles of pelvic floor function in urination, defecation, sexual function, and postural control with deep core mm system.   Following Tx today which pt tolerated without complaints,  pt demo'd proper body  mechanics to minimize straining pelvic floor.  Plan to address realignment of spine/ pelvis at next session to help promote balance/ minimize falls and optimize IAP system for improved pelvic floor function.  Plan to address pelvic floor issues once pelvis and spine are realigned to yield better outcomes.    Pt benefits from skilled PT.    OBJECTIVE IMPAIRMENTS decreased activity tolerance, decreased coordination, decreased endurance, decreased mobility, difficulty walking, decreased ROM, decreased strength, decreased safety awareness, hypomobility, increased muscle spasms, impaired flexibility, improper body mechanics, postural dysfunction, and pain. scar restrictions   ACTIVITY LIMITATIONS  self-care,  home chores, work tasks    PARTICIPATION LIMITATIONS:  community, gym activities    PERSONAL FACTORS       are also affecting patient's functional outcome.    REHAB POTENTIAL: Good   CLINICAL DECISION MAKING: Evolving/moderate complexity   EVALUATION COMPLEXITY: Moderate    PATIENT EDUCATION:    Education details: Showed pt anatomy images. Explained muscles attachments/ connection, physiology of deep core system/ spinal- thoracic-pelvis-lower kinetic chain as they relate to pt's presentation, Sx, and past Hx. Explained what and how these areas of deficits need to be restored to balance and function    See Therapeutic activity / neuromuscular re-education section  Answered pt's questions.   Person educated: Patient Education method: Explanation, Demonstration, Tactile cues, Verbal cues, and Handouts Education comprehension: verbalized understanding, returned demonstration, verbal cues required, tactile cues required, and needs further education     PLAN: PT FREQUENCY: 1x/week   PT DURATION: 10 weeks   PLANNED INTERVENTIONS: Therapeutic exercises, Therapeutic activity, Neuromuscular re-education, Balance training, Gait training, Patient/Family education, Self Care, Joint  mobilization, Spinal mobilization, Moist heat, Taping, and Manual therapy, dry needling.   PLAN FOR NEXT SESSION: See clinical impression for plan     GOALS: Goals reviewed with patient? Yes  SHORT TERM GOALS: Target date: 08/04/2023    Pt will demo IND with HEP                    Baseline: Not IND            Goal status: INITIAL   LONG TERM GOALS: Target date: 09/15/2023   1.Pt will demo proper deep core coordination without chest breathing and optimal excursion of diaphragm/pelvic floor in order to promote spinal stability and pelvic floor function  Baseline: dyscoordination Goal status: INITIAL  2.  Pt will demo > 5 pt change on FOTO  to improve QOL and function  PFDI Prolapse - 13  Lower score = better function  Pelvic Pain baseline -13 Lower score = better function   Lumber baseline  - 73 Higher score = better function   Goal status: INITIAL  3.  Pt will demo proper body mechanics in against gravity tasks and ADLs  work tasks, fitness  to minimize straining pelvic floor / back    Baseline: not IND, improper form that places strain on pelvic floor  Goal status: INITIAL    4. Pt will demo increased gait speed > 1.4 m/s with reciprocal gait pattern, longer stride length  in order to ambulate safely in community and return to fitness routine  Baseline:  1.3 m/s ( limited L posterior rotation  of thorax and pelvis )  Goal status: INITIAL    5. Pt will report < 3/10 pain CLBP on a 9 hr work shift by 4pm in order to to be efficient and improve QOL   Baseline: on busy days at work 9 hours , pain level 6-7/10 non-radiating at 4pm , pt is on her feet 90% of the time  Goal status: INITIAL   6. Pt will report: Stool type 4   from 0 % of the time to > 50% of the time   Baseline: Stool Type 6 across 100% of the time  Goal status: INITIAL  7. Pt will demo levelled pelvic girdle and shoulder height and report no CLBP with R sideflexion in order to progress to deep  core strengthening HEP and restore mobility at spine, pelvis, gait, posture minimize falls, and improve balance  , CCLBP with R sideflexion   Goal: Initial    Mariane Masters, PT 07/07/2023, 9:06 AM

## 2023-07-07 NOTE — Patient Instructions (Signed)
   Proper body mechanics with getting out of a chair to decrease strain  on back &pelvic floor   Avoid holding your breath when Getting out of the chair:  Scoot to front part of chair chair Heels behind knees, feet are hip width apart, nose over toes  Inhale like you are smelling roses Exhale to stand   __  Sitting with feet on ground, four points of contact Catch yourself crossing ankles and thighs  __  Inhale quiet, exhale on exertion with lifting weights, notices feet Through nose   ___  Sit to stand, not lock knees on rise  Notice 4 points of feet , more weight pressure across ballmounds  to decrease low back pain

## 2023-07-14 ENCOUNTER — Encounter: Payer: Managed Care, Other (non HMO) | Admitting: Physical Therapy

## 2023-07-23 ENCOUNTER — Ambulatory Visit: Payer: Managed Care, Other (non HMO) | Attending: Internal Medicine | Admitting: Physical Therapy

## 2023-07-23 ENCOUNTER — Telehealth: Payer: Self-pay | Admitting: Physical Therapy

## 2023-07-23 DIAGNOSIS — R2689 Other abnormalities of gait and mobility: Secondary | ICD-10-CM | POA: Insufficient documentation

## 2023-07-23 DIAGNOSIS — M533 Sacrococcygeal disorders, not elsewhere classified: Secondary | ICD-10-CM | POA: Insufficient documentation

## 2023-07-23 DIAGNOSIS — M5459 Other low back pain: Secondary | ICD-10-CM | POA: Insufficient documentation

## 2023-07-23 DIAGNOSIS — R278 Other lack of coordination: Secondary | ICD-10-CM | POA: Insufficient documentation

## 2023-07-23 NOTE — Telephone Encounter (Signed)
Physical therapist called pt re: missed appt today.   Left messagee that there is no charge to missed appts but there is a waiting list with other patients needing treatment at our clinic.    We would be grateful if pt can please call to confirm whether they wish to keep or cancel remaining appts [phone].    Remaining appts will be removed after 2 no-show appts per rehab department policy.

## 2023-07-28 ENCOUNTER — Ambulatory Visit: Payer: Managed Care, Other (non HMO) | Admitting: Physical Therapy

## 2023-07-28 DIAGNOSIS — M533 Sacrococcygeal disorders, not elsewhere classified: Secondary | ICD-10-CM | POA: Diagnosis present

## 2023-07-28 DIAGNOSIS — R2689 Other abnormalities of gait and mobility: Secondary | ICD-10-CM | POA: Diagnosis present

## 2023-07-28 DIAGNOSIS — M5459 Other low back pain: Secondary | ICD-10-CM | POA: Diagnosis present

## 2023-07-28 DIAGNOSIS — R278 Other lack of coordination: Secondary | ICD-10-CM

## 2023-07-28 NOTE — Therapy (Signed)
OUTPATIENT PHYSICAL THERAPY Treatment    Patient Name: Leslie Jordan MRN: [id number] DOB:1981-03-25, 42 y.o., female Today's Date: 07/28/2023   PT End of Session - 07/28/23 0903     Visit Number 2    Number of Visits 10    Date for PT Re-Evaluation 09/15/23    PT Start Time 0850    PT Stop Time 0935    PT Time Calculation (min) 45 min    Activity Tolerance Patient tolerated treatment well;No increased pain    Behavior During Therapy WFL for tasks assessed/performed             Past Medical History:  Diagnosis Date   Acne vulgaris    Iron deficiency anemia    PCOS (polycystic ovarian syndrome)    Past Surgical History:  Procedure Laterality Date   LAPAROSCOPIC CHOLECYSTECTOMY  2006   TUBAL LIGATION  2009   after delivery   Patient Active Problem List   Diagnosis Date Noted   Family history of ovarian cancer 12/15/2019   Family history of breast cancer 12/15/2019   Family history of uterine cancer 12/15/2019   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   Acute anxiety 10/22/2018   Fatigue 02/02/2017   Alopecia 02/02/2017   Anemia, iron deficiency 12/13/2014   Routine general medical examination at a health care facility 12/02/2013    PCP: Nemiah Commander  REFERRING PROVIDER: Nemiah Commander   REFERRING DIAG: Pelvic floor dysfunction   Rationale for Evaluation and Treatment Rehabilitation  THERAPY DIAG:  Sacrococcygeal disorders, not elsewhere classified  Other abnormalities of gait and mobility  Other lack of coordination  Other Low back pain   ONSET DATE:   SUBJECTIVE:          SUBJECTIVE STATEMENT TODAY  :     7 days ago, pt was bench pressing  and noticed weakness R shoulder to get the bar up ( pt is not sure the amount of weight, each set her trainer added more weight. Pt has not been to the gym for the past 2 weeks) . Marland Kitchen Pt continued with a straight up overhead, it felt tight and and her posture was off with uneven strength. There was not popping nor pain at the  time. It started to hurt afterwards.  Pt consulted orthopedist and labral tear was suspected with X rays. Pt is going to see at [facility] today and was recommended to see PT for shoulder.  Shoulder pain 2/10 after cortisone injection. Prior to cortisone , 6-7/10  pain .  Denied radiating pain.  Pt is  able to perform ADLs without pain , pt has not returned to lifting weight. Body weight planks and push ups/ triceps without pain. There was pain with body weight exercises before the cortisone.  SUBJECTIVE STATEMENT ON 07/07/23 EVAL  :  1) loose stools Stool consistency Type 6 since her gallbladder removal in 2006. Pt has regular bowel movements 1 x day. Pt was eating well but stopped. Pt is trying to get back on track with less sugar and carbs.  Fluid intake: 80 floz of water, 12 fl oz coffee, 24 fl oz Pepsi,    2) CLBP: on busy days at work 9 hours , pain level 6-7/10 non-radiating at 4pm , pt is on her feet 90% of the time   3)  tight upper trap R>L:  always tight    Pt goes to gym 3 x week, lower body and HIIT and works with trainer with upper and lower body 2 x week.  Pt had done sit up and crunches int he past but do not currently. Lifts weights on machines.  Pt is not breathing out when lifting weight.  PERTINENT HISTORY:  Tubal ligation, gall bladder removal,  denied tailbone/ ankle, knee , hip pain. 3 vaginal deliveries with perineal tear with one and two episiotomy with all children   PAIN:  Are you having pain? Yes: See above   PRECAUTIONS: None  WEIGHT BEARING RESTRICTIONS: No  FALLS:  Has patient fallen in last 6 months? No  LIVING ENVIRONMENT: Lives with: with son, his fiance, daughter  ( husband passed away)  Lives in: House/apartment Stairs: 2 story, STE with no rail  OCCUPATION: Engineer, site    PLOF: Independent  PATIENT GOALS:  To tighten and straighten everything up, not having low back ache at the end of the day , relax upper trap,  "become the best version I can be physically"     OBJECTIVE:    [facility] PT Assessment - 07/28/23 0909       Observation/Other Assessments   Observations rounded shoulders/ L shoulder lowered , 21 cm L, 20 cm earlobe to acromium      Coordination   Coordination and Movement Description R scapular dyskinesis      Other:   Other/ Comments Digit III reach behind neck and back: T4-5 B,      Palpation   SI assessment  L shoulder lowered, R iliac crest lowered    Palpation comment deviated T4-5 to R, tightness o paraspinals, intercostals posterior R , upper trap medial trap tightness ,             [facility] Adult PT Treatment/Exercise - 07/28/23 0933       Therapeutic Activites    Other Therapeutic Activities explained anatomy and trajectory ofr combined apporach ith PT for shoulder and chiropractic Tx      Neuro Re-ed    Neuro Re-ed Details  cued for HEP for realignment of thoracic and shoulder      Modalities   Modalities Moist Heat      Moist Heat Therapy   Number Minutes Moist Heat 5 Minutes    Moist Heat Location --   thoracic ( R trunk posterior rotation and then in supine ( unbilled)     Manual Therapy   Manual therapy comments STM/MWM at R scapula, T4-5 segments, intercostals, paraspinals, upper trap                 HOME EXERCISE PROGRAM: See pt instruction section    ASSESSMENT:  CLINICAL IMPRESSION:   Addressed realignment of spine/ pelvis today with manual Tx at R scapular/ intercostals, deviated thoracic segments.  Pt showed more R scapular mobility  and no more scapular  dyskinesis  post Tx but will still require more manual Tx to address uneven shoulder height 2/2 deviations at spine and mm imbalances at neck and shoulder.   Explained about improving alignment to help minimize shoulder injury. Advised to  avoid weight lifting with arms until alignment is restored and she has strengthened posterior chain of mm ( thoracoscapular/ lumbar system) which will be addressed with upcoming sessions. Educated about bodyweight training for now with reverse planks , triceps which help minimize rounded shoulder posture. Pt voiced understanding.   Asked pt to observe her posture and positions at work which will help therapist understand if HEP or posture training is needed to minimize asymmetries to spinal alignment.   Plan to address pelvic floor issues once pelvis and spine are realigned to yield better outcomes.    Pt benefits from skilled PT.    OBJECTIVE IMPAIRMENTS decreased activity tolerance, decreased coordination, decreased endurance, decreased mobility, difficulty walking, decreased ROM, decreased strength, decreased safety awareness, hypomobility, increased muscle spasms, impaired flexibility, improper body mechanics, postural dysfunction, and pain. scar restrictions   ACTIVITY LIMITATIONS  self-care,  home chores, work tasks    PARTICIPATION LIMITATIONS:  community, gym activities    PERSONAL FACTORS       are also affecting patient's functional outcome.    REHAB POTENTIAL: Good   CLINICAL DECISION MAKING: Evolving/moderate complexity   EVALUATION COMPLEXITY: Moderate    PATIENT EDUCATION:    Education details: Showed pt anatomy images. Explained muscles attachments/ connection, physiology of deep core system/ spinal- thoracic-pelvis-lower kinetic chain as they relate to pt's presentation, Sx, and past Hx. Explained what and how these areas of deficits need to be restored to balance and function    See Therapeutic activity / neuromuscular re-education section  Answered pt's questions.   Person educated: Patient Education method: Explanation, Demonstration, Tactile cues, Verbal cues, and Handouts Education comprehension: verbalized understanding, returned demonstration, verbal cues  required, tactile cues required, and needs further education     PLAN: PT FREQUENCY: 1x/week   PT DURATION: 10 weeks   PLANNED INTERVENTIONS: Therapeutic exercises, Therapeutic activity, Neuromuscular re-education, Balance training, Gait training, Patient/Family education, Self Care, Joint mobilization, Spinal mobilization, Moist heat, Taping, and Manual therapy, dry needling.   PLAN FOR NEXT SESSION: See clinical impression for plan     GOALS: Goals reviewed with patient? Yes  SHORT TERM GOALS: Target date: 08/04/2023    Pt will demo IND with HEP                    Baseline: Not IND            Goal status: INITIAL   LONG TERM GOALS: Target date: 09/15/2023   1.Pt will demo proper deep core coordination without chest breathing and optimal excursion of diaphragm/pelvic floor in order to promote spinal stability and pelvic floor function  Baseline: dyscoordination Goal status: INITIAL  2.  Pt will demo > 5 pt change on FOTO  to improve QOL and function  PFDI Prolapse - 13  Lower score = better function  Pelvic Pain baseline -13 Lower score = better function   Lumber baseline  - 73 Higher score = better function   Goal status: INITIAL  3.  Pt will demo proper body mechanics in against gravity tasks and ADLs  work tasks, fitness  to minimize straining pelvic floor / back    Baseline: not IND, improper form that places strain on pelvic floor  Goal status: INITIAL  4. Pt will demo increased gait speed > 1.4 m/s with reciprocal gait pattern, longer stride length  in order to ambulate safely in community and return to fitness routine  Baseline:  1.3 m/s ( limited L posterior rotation of thorax and pelvis )  Goal status: INITIAL    5. Pt will report < 3/10 pain CLBP on a 9 hr work shift by 4pm in order to to be efficient and improve QOL   Baseline: on busy days at work 9 hours , pain level 6-7/10 non-radiating at 4pm , pt is on her feet 90% of the time  Goal  status: INITIAL   6. Pt will report: Stool type 4   from 0 % of the time to > 50% of the time   Baseline: Stool Type 6 across 100% of the time  Goal status: INITIAL  7. Pt will demo levelled pelvic girdle and shoulder height and report no CLBP with R sideflexion in order to progress to deep core strengthening HEP and restore mobility at spine, pelvis, gait, posture minimize falls, and improve balance  , CCLBP with R sideflexion   Goal: Initial   8. Pt will demo less rounded shoulders and increased distance from earlobe to acromium > 2-5 cm and more levelled shoulders and no scapular dyskinesis on R  in order to minimize shoulder injuries  with overhead weight lifting  Baseline: rounded shoulders/ L shoulder lowered , 21 cm L, 20 cm earlobe to acromium , scapular dyskinesis on R  , L shoulder lowered than R  Goal Status: INITIAL   Mariane Masters, PT 07/28/2023, 9:03 AM

## 2023-07-28 NOTE — Patient Instructions (Signed)
   Lengthen Back rib by R  shoulder   ( winging)    Lie on L  side , pillow between knees and under head  Pull  R arm overhead over mattress, grab the edge of mattress,pull it upward, drawing elbow away from ears  Breathing 10 reps  Brushing arm with 3/4 turn onto pillow behind back  Lying on L  side ,Pillow/ Block between knees     dragging top forearm across ribs below breast rotating 3/4 turn,  rotating  _R_ only this week ,  relax onto the pillow behind the back  and then back to other palm , maintain top palm on body whole top and not lift shoulder   ___  Zig zag stretch :  both sides   Reclined twist for hips and side of the hips/ legs  Lay on your back, knees bend Scoot hips to the R , leave shoulders in place Wobble knees to the L side 45 deg and to midline  10 reps    For 5 min  resting onto pillows to keep leg at the same width of hips Pillow under L thigh to minimize too much strain '  __

## 2023-08-04 ENCOUNTER — Ambulatory Visit: Payer: Managed Care, Other (non HMO) | Admitting: Physical Therapy

## 2023-08-04 ENCOUNTER — Telehealth: Payer: Self-pay | Admitting: Physical Therapy

## 2023-08-04 NOTE — Telephone Encounter (Signed)
Physical therapist called pt re: missed appt today. Provided encouragement to keep up with HEP.   Left message that there is no charge to missed appts but there is a waiting list with other patients needing treatment at our clinic.    We would be grateful if pt can please call to confirm whether they wish to keep or cancel remaining appts [phone].  Remaining appts will be removed after 2 no-show appts per rehab department policy.

## 2023-08-04 NOTE — Telephone Encounter (Signed)
Therapist left follow up voicemail . Front desk staff noticed therapist today was pt's 2nd no show appt. Based on policy, all remaining appts were removed. If pt wants to schedule more appts, they will be scheduled one at a time.

## 2023-08-11 ENCOUNTER — Ambulatory Visit: Payer: Managed Care, Other (non HMO) | Admitting: Physical Therapy

## 2023-08-18 ENCOUNTER — Ambulatory Visit: Payer: Managed Care, Other (non HMO) | Admitting: Physical Therapy

## 2023-08-25 ENCOUNTER — Encounter: Payer: Managed Care, Other (non HMO) | Admitting: Physical Therapy

## 2023-09-01 ENCOUNTER — Encounter: Payer: Managed Care, Other (non HMO) | Admitting: Physical Therapy

## 2023-09-08 ENCOUNTER — Encounter: Payer: Managed Care, Other (non HMO) | Admitting: Physical Therapy

## 2023-09-15 ENCOUNTER — Encounter: Payer: Managed Care, Other (non HMO) | Admitting: Physical Therapy

## 2023-09-22 ENCOUNTER — Encounter: Payer: Managed Care, Other (non HMO) | Admitting: Physical Therapy

## 2023-09-29 ENCOUNTER — Encounter: Payer: Managed Care, Other (non HMO) | Admitting: Physical Therapy

## 2023-10-06 ENCOUNTER — Encounter: Payer: Managed Care, Other (non HMO) | Admitting: Physical Therapy

## 2024-06-09 ENCOUNTER — Other Ambulatory Visit: Payer: Self-pay | Admitting: Internal Medicine

## 2024-06-09 DIAGNOSIS — N644 Mastodynia: Secondary | ICD-10-CM

## 2024-06-09 DIAGNOSIS — N63 Unspecified lump in unspecified breast: Secondary | ICD-10-CM
